# Patient Record
Sex: Male | Born: 1970
Health system: Southern US, Community
[De-identification: ages and names within clinical notes are randomized; demographics above are authoritative.]

## PROBLEM LIST (undated history)

## (undated) DIAGNOSIS — T7840XA Allergy, unspecified, initial encounter: Secondary | ICD-10-CM

## (undated) DIAGNOSIS — E785 Hyperlipidemia, unspecified: Secondary | ICD-10-CM

## (undated) HISTORY — DX: Allergy, unspecified, initial encounter: T78.40XA

## (undated) HISTORY — DX: Hyperlipidemia, unspecified: E78.5

## (undated) HISTORY — PX: WISDOM TOOTH EXTRACTION: SHX21

---

## 2006-04-19 ENCOUNTER — Encounter: Admission: RE | Admit: 2006-04-19 | Discharge: 2006-04-19 | Payer: Self-pay | Admitting: Internal Medicine

## 2008-05-29 ENCOUNTER — Ambulatory Visit: Payer: Self-pay | Admitting: Internal Medicine

## 2008-07-17 ENCOUNTER — Ambulatory Visit: Payer: Self-pay | Admitting: Internal Medicine

## 2008-12-17 ENCOUNTER — Ambulatory Visit: Payer: Self-pay | Admitting: Internal Medicine

## 2009-02-02 ENCOUNTER — Ambulatory Visit: Payer: Self-pay | Admitting: Internal Medicine

## 2010-03-14 ENCOUNTER — Ambulatory Visit: Payer: Self-pay | Admitting: Internal Medicine

## 2011-01-23 ENCOUNTER — Encounter: Payer: Self-pay | Admitting: Internal Medicine

## 2011-01-24 ENCOUNTER — Other Ambulatory Visit: Payer: 59 | Admitting: Internal Medicine

## 2011-01-24 DIAGNOSIS — E785 Hyperlipidemia, unspecified: Secondary | ICD-10-CM

## 2011-01-24 LAB — LIPID PANEL
HDL: 54 mg/dL (ref >39)
LDL Cholesterol: 116 mg/dL — ABNORMAL HIGH (ref 0–99)
Triglycerides: 165 mg/dL — ABNORMAL HIGH (ref <150)
VLDL: 33 mg/dL (ref 0–40)

## 2011-01-24 LAB — HEPATIC FUNCTION PANEL
Albumin: 4.8 g/dL (ref 3.5–5.2)
Total Bilirubin: 0.9 mg/dL (ref 0.3–1.2)
Total Protein: 6.7 g/dL (ref 6.0–8.3)

## 2011-01-27 ENCOUNTER — Ambulatory Visit (INDEPENDENT_AMBULATORY_CARE_PROVIDER_SITE_OTHER): Payer: 59 | Admitting: Internal Medicine

## 2011-01-27 ENCOUNTER — Encounter: Payer: Self-pay | Admitting: Internal Medicine

## 2011-01-27 DIAGNOSIS — E785 Hyperlipidemia, unspecified: Secondary | ICD-10-CM | POA: Insufficient documentation

## 2011-01-27 DIAGNOSIS — E669 Obesity, unspecified: Secondary | ICD-10-CM

## 2011-01-27 NOTE — Progress Notes (Signed)
  Subjective:    Patient ID: Jorge Hughes, male    DOB: 1970-08-15, 40 y.o.   MRN: [id number]  HPI pleasant 40 year old white male with history of hyperlipidemia currently on Zocor 20 mg daily. Lipid panel liver functions drawn recently and really showed no change from 2011. Would like to have LDL less than 100. Total cholesterol is 203 and previously was 202. Triglycerides are 165 and previously were 120. HDL is 54. LDL is 116 and previously was 122. Wife is due to  deliver a new baby any day now. Patient is overweight.    Review of Systems     Objective:   Physical Exam chest clear; cardiac exam regular rate and rhythm; extremities without edema        Assessment & Plan:  Hyperlipidemia  Obesity  Encouraged diet and exercise and weight loss. Increase Zocor to 40 mg daily. New prescription written for #30 with 5 refills generic. Schedule physical examination February 2013

## 2011-08-03 ENCOUNTER — Other Ambulatory Visit: Payer: 59 | Admitting: Internal Medicine

## 2011-08-03 DIAGNOSIS — E7800 Pure hypercholesterolemia, unspecified: Secondary | ICD-10-CM

## 2011-08-03 DIAGNOSIS — Z0000 Encounter for general adult medical examination without abnormal findings: Secondary | ICD-10-CM

## 2011-08-03 LAB — LIPID PANEL
Cholesterol: 249 mg/dL — ABNORMAL HIGH (ref 0–200)
LDL Cholesterol: 174 mg/dL — ABNORMAL HIGH (ref 0–99)
Triglycerides: 97 mg/dL (ref <150)

## 2011-08-03 LAB — COMPREHENSIVE METABOLIC PANEL
Albumin: 4.7 g/dL (ref 3.5–5.2)
Alkaline Phosphatase: 73 U/L (ref 39–117)
CO2: 27 mEq/L (ref 19–32)
Calcium: 9.4 mg/dL (ref 8.4–10.5)
Chloride: 101 mEq/L (ref 96–112)
Glucose, Bld: 79 mg/dL (ref 70–99)
Potassium: 4.6 mEq/L (ref 3.5–5.3)
Sodium: 139 mEq/L (ref 135–145)
Total Protein: 6.9 g/dL (ref 6.0–8.3)

## 2011-08-03 LAB — CBC WITH DIFFERENTIAL/PLATELET
HCT: 48.2 % (ref 39.0–52.0)
Hemoglobin: 16.1 g/dL (ref 13.0–17.0)
Lymphocytes Relative: 28 % (ref 12–46)
Lymphs Abs: 1.7 10*3/uL (ref 0.7–4.0)
Monocytes Relative: 6 % (ref 3–12)
Neutro Abs: 3.8 10*3/uL (ref 1.7–7.7)
Neutrophils Relative %: 62 % (ref 43–77)
RBC: 5.34 MIL/uL (ref 4.22–5.81)

## 2011-08-04 ENCOUNTER — Encounter: Payer: Self-pay | Admitting: Internal Medicine

## 2011-08-04 ENCOUNTER — Ambulatory Visit (INDEPENDENT_AMBULATORY_CARE_PROVIDER_SITE_OTHER): Payer: 59 | Admitting: Internal Medicine

## 2011-08-04 VITALS — BP 126/82 | HR 84 | Temp 97.6°F | Ht 69.25 in | Wt 287.0 lb

## 2011-08-04 DIAGNOSIS — E669 Obesity, unspecified: Secondary | ICD-10-CM

## 2011-08-04 DIAGNOSIS — Z0000 Encounter for general adult medical examination without abnormal findings: Secondary | ICD-10-CM

## 2011-08-04 DIAGNOSIS — E785 Hyperlipidemia, unspecified: Secondary | ICD-10-CM

## 2011-08-04 LAB — POCT URINALYSIS DIPSTICK
Bilirubin, UA: NEGATIVE
Blood, UA: NEGATIVE
Glucose, UA: NEGATIVE
Nitrite, UA: NEGATIVE

## 2011-08-20 NOTE — Progress Notes (Signed)
Subjective:    Patient ID: Jorge Hughes, male    DOB: 1971/06/01, 41 y.o.   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 He is here for health maintenance and evaluation and evaluation of medical problems. Patient initially started on Zocor 2010 at that time total cholesterol was 250 with an LDL cholesterol of 162 with normal triglycerides and HDL cholesterol of 67. He had a good response and total cholesterol came down from 250-205 and from 162-131 by July 2010 and we increase Zocor to 20 mg daily. In February 2011 total cholesterol was 202 with an LDL cholesterol of 122 on Zocor 20 mg daily. In August 2012 total cholesterol was 203, triglycerides were 165 and previously had been 120 and LDL cholesterol was 116. Was trying to get LDL below 100 and we increase Zocor to 40 mg daily and that's when the memory issues started.  No known drug allergies. 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 He is allergic to poison ivy. He fractured a rib falling off a horse in 1999. Had a tetanus immunization update on 08/28/2003. Had strep throat 10/03/2007.  Family history: Mother with history of breast cancer. Grandfather with history of coronary artery disease also had CABG. One brother and one sister. Father with history of kidney stones.  Patient is married. Smokes cigars sometimes. Usually drinks beer or liquor daily. Doesn't get much exercise.    Review of Systems  Constitutional: Negative.   HENT: Negative.   Eyes: Negative.   Respiratory: Negative.   Cardiovascular: Negative.   Gastrointestinal: Negative.   Genitourinary: Negative.   Musculoskeletal: Negative.   Hematological: Negative.   Psychiatric/Behavioral: Negative.        Objective:   Physical Exam  Vitals  reviewed. Constitutional: He is oriented to person, place, and time. He appears well-developed and well-nourished.  HENT:  Head: Normocephalic and atraumatic.  Right Ear: External ear normal.  Left Ear: External ear normal.  Mouth/Throat: Oropharynx is clear and moist. No oropharyngeal exudate.  Eyes: Conjunctivae and EOM are normal. Pupils are equal, round, and reactive to light. Right eye exhibits no discharge. Left eye exhibits no discharge. No scleral icterus.  Neck: Neck supple. No JVD present. No thyromegaly present.  Cardiovascular: Normal rate, regular rhythm, normal heart sounds and intact distal pulses.   No murmur heard. Pulmonary/Chest: Effort normal and breath sounds normal. He has no wheezes. He has no rales.  Abdominal: Soft. Bowel sounds are normal. He exhibits no mass. There is no tenderness. There is no rebound.  Genitourinary:       No hernias to direct palpation  Musculoskeletal: Normal range of motion. He exhibits no edema.  Lymphadenopathy:    He has no cervical adenopathy.  Neurological: He is alert and oriented to person, place, and time. He has normal reflexes. He displays normal reflexes. No cranial nerve deficit. Coordination normal.  Skin: Skin is warm and dry. He is not diaphoretic.  Psychiatric: He has a normal mood and affect. His behavior is normal.          Assessment & Plan:  Hyperlipidemia-unable to tolerate 40 mg of Zocor because of memory issues  Obesity-patient gets little exercise and doesn't watch diet as closely as he should  Plan: Change to Crestor and recheck lipid panel liver functions  in 3 months. Advised patient to diet and exercise.

## 2011-08-20 NOTE — Patient Instructions (Signed)
Change from Zocor to Crestor and return in 3 months for fasting lipid panel liver functions

## 2012-07-04 ENCOUNTER — Ambulatory Visit: Payer: Self-pay | Admitting: Physician Assistant

## 2012-07-04 VITALS — BP 140/80 | HR 60 | Temp 98.1°F | Resp 18 | Ht 71.0 in | Wt 292.0 lb

## 2012-07-04 DIAGNOSIS — Z0289 Encounter for other administrative examinations: Secondary | ICD-10-CM

## 2012-07-04 NOTE — Patient Instructions (Addendum)

## 2012-07-04 NOTE — Progress Notes (Signed)
Subjective:    Patient ID: Jorge Hughes, male    DOB: [birth year], 42 y.o.   MRN: [id number]  HPI  This 42 y.o. male presents for DOT Medical Certification Examination.   Past Medical History  Diagnosis Date  . Hyperlipidemia     Past Surgical History  Procedure Date  . Wisdom tooth extraction     Prior to Admission medications   Medication Sig Start Date End Date Taking? Authorizing Provider  rosuvastatin (CRESTOR) 10 MG tablet Take 10 mg by mouth daily.   Yes Historical Provider, MD    No Known Allergies  History   Social History  . Marital Status: Married    Spouse Name: Scorpio Fortin    Number of Children: 3  . Years of Education: 20   Occupational History  . General Contractor    Social History Main Topics  . Smoking status: Former Smoker    Types: Cigars  . Smokeless tobacco: Never Used  . Alcohol Use: 1.2 oz/week    2 Cans of beer per week     Comment: socially  . Drug Use: No  . Sexually Active: Yes -- Male partner(s)    Birth Control/ Protection: None   Other Topics Concern  . Not on file   Social History Narrative   Lives with his wife and 3 children.      Family History  Problem Relation Age of Onset  . Cancer Mother 46    breast  . Gallstones Father   . Ovarian cysts Sister   . Cancer Maternal Grandfather   . Diabetes Paternal Grandfather     Review of Systems  Constitutional: Negative.   HENT: Negative.   Eyes: Negative.   Respiratory: Negative.   Cardiovascular: Negative.   Gastrointestinal: Negative.   Genitourinary: Negative.   Musculoskeletal: Negative.   Skin: Negative.   Neurological: Negative.   Hematological: Negative.   Psychiatric/Behavioral: Negative.        Objective:   Physical Exam  Vitals reviewed. Constitutional: He is oriented to person, place, and time. He appears well-developed and well-nourished. He is active and cooperative. No distress.  HENT:  Head: Normocephalic and atraumatic.    Right Ear: Hearing, tympanic membrane, external ear and ear canal normal.  Left Ear: Hearing, tympanic membrane, external ear and ear canal normal.  Nose: Nose normal.  Mouth/Throat: Uvula is midline and oropharynx is clear and moist. He does not have dentures. No oral lesions. Normal dentition. No uvula swelling or dental caries. No oropharyngeal exudate.  Eyes: Conjunctivae normal, EOM and lids are normal. Pupils are equal, round, and reactive to light. Right eye exhibits no discharge. Left eye exhibits no discharge. No scleral icterus.  Fundoscopic exam:      The right eye shows no arteriolar narrowing, no AV nicking, no exudate, no hemorrhage and no papilledema. The right eye shows no red reflex.      The left eye shows no arteriolar narrowing, no AV nicking, no exudate, no hemorrhage and no papilledema. The left eye shows no red reflex. Neck: Normal range of motion and full passive range of motion without pain. Neck supple. No spinous process tenderness and no muscular tenderness present. No mass and no thyromegaly present.  Cardiovascular: Normal rate, regular rhythm, normal heart sounds and intact distal pulses.   Pulses:      Radial pulses are 2+ on the right side, and 2+ on the left side.       Dorsalis pedis pulses are 2+  on the right side, and 2+ on the left side.       Posterior tibial pulses are 2+ on the right side, and 2+ on the left side.  Pulmonary/Chest: Effort normal and breath sounds normal.  Abdominal: Soft. Bowel sounds are normal. There is no hepatosplenomegaly. There is no tenderness. There is no CVA tenderness. No hernia. Hernia confirmed negative in the right inguinal area and confirmed negative in the left inguinal area.  Genitourinary: Testes normal and penis normal. Circumcised.  Musculoskeletal: Normal range of motion. He exhibits no edema and no tenderness.       Cervical back: Normal.       Thoracic back: Normal.       Lumbar back: Normal.  Lymphadenopathy:        Head (right side): No tonsillar, no preauricular, no posterior auricular and no occipital adenopathy present.       Head (left side): No tonsillar, no preauricular, no posterior auricular and no occipital adenopathy present.    He has no cervical adenopathy.       Right: No inguinal and no supraclavicular adenopathy present.       Left: No inguinal and no supraclavicular adenopathy present.  Neurological: He is alert and oriented to person, place, and time. He has normal strength and normal reflexes. No cranial nerve deficit or sensory deficit. Coordination normal.  Skin: Skin is warm, dry and intact. No rash noted. No cyanosis or erythema. Nails show no clubbing.  Psychiatric: He has a normal mood and affect. His speech is normal and behavior is normal. Judgment and thought content normal. Cognition and memory are normal.   No labs indicated.      Assessment & Plan:   1. Health examination of defined subpopulation   Forms completed. 2 year certification given.  Card expires 07/04/2014.

## 2012-08-02 ENCOUNTER — Other Ambulatory Visit: Payer: Self-pay | Admitting: Internal Medicine

## 2012-09-16 ENCOUNTER — Other Ambulatory Visit: Payer: Self-pay | Admitting: Internal Medicine

## 2012-09-16 NOTE — Telephone Encounter (Signed)
Needs appt before we can refill

## 2012-09-17 ENCOUNTER — Other Ambulatory Visit: Payer: Self-pay

## 2012-09-17 MED ORDER — CRESTOR 10 MG PO TABS: 10.0000 mg | ORAL_TABLET | Freq: Every day | ORAL | Status: DC

## 2012-09-17 NOTE — Telephone Encounter (Signed)
Patient has scheduled an appointment for 10/28/2012 with fasting labs prior to OV.

## 2012-09-17 NOTE — Telephone Encounter (Signed)
Addressed this already doesn't he need OV?

## 2012-10-25 ENCOUNTER — Other Ambulatory Visit: Payer: 59 | Admitting: Internal Medicine

## 2012-10-25 DIAGNOSIS — E785 Hyperlipidemia, unspecified: Secondary | ICD-10-CM

## 2012-10-25 DIAGNOSIS — Z79899 Other long term (current) drug therapy: Secondary | ICD-10-CM

## 2012-10-25 LAB — HEPATIC FUNCTION PANEL
AST: 24 U/L (ref 0–37)
Albumin: 4.7 g/dL (ref 3.5–5.2)
Alkaline Phosphatase: 76 U/L (ref 39–117)
Total Protein: 6.9 g/dL (ref 6.0–8.3)

## 2012-10-25 LAB — LIPID PANEL
HDL: 49 mg/dL (ref >39)
LDL Cholesterol: 76 mg/dL (ref 0–99)
Triglycerides: 91 mg/dL (ref <150)

## 2012-10-28 ENCOUNTER — Ambulatory Visit (INDEPENDENT_AMBULATORY_CARE_PROVIDER_SITE_OTHER): Payer: 59 | Admitting: Internal Medicine

## 2012-10-28 ENCOUNTER — Encounter: Payer: Self-pay | Admitting: Internal Medicine

## 2012-10-28 VITALS — BP 126/80 | HR 92 | Wt 292.0 lb

## 2012-10-28 DIAGNOSIS — E785 Hyperlipidemia, unspecified: Secondary | ICD-10-CM

## 2012-11-02 ENCOUNTER — Encounter: Payer: Self-pay | Admitting: Physician Assistant

## 2012-11-02 NOTE — Progress Notes (Signed)
  Subjective:    Patient ID: Jorge Hughes, male    DOB: 08-28-1970, 42 y.o.   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 He initially started on Zocor in 2010 at that time his total cholesterol was 250 with an LDL cholesterol of 162, with normal triglycerides, and an HDL cholesterol of 67. He had a good response with Zocor which was subsequently increased from 10-20 mg daily. In August 2012 total cholesterol was 203, triglycerides 165 and LDL cholesterol was 116. We were trying to get LDL below 100 and therefore increase Zocor 40 mg daily Is when the memory issue started. Subsequently I changed him to Crestor and he says memory issues have started all over again. This is surprising with low dose Crestor.  If he cannot tolerate statin medication, there is not much left to control his cholesterol  except for Zetia. He would light to stay off statin medication at present time and see if memory issues will improve. He needs to diet exercise and lose weight. He is markedly overweight.  Grandfather had history of coronary artery disease. Patient usually drinks beer or liquor daily. Sometimes he smokes cigars.      Review of Systems     Objective:   Physical Exam chest clear to auscultation. Cardiac regular rate normal S1 and S2. Extremities without edema.        Assessment & Plan:  Hyperlipidemia -- patient complaining of memory loss  on Zocor 40 mg daily and Crestor 10 mg daily  Plan: Patient may stay off statin medication for a few months and reassess in 3-6 months. Option would be Zetia 10 mg daily.  His lipid panel 10/25/2012 was entirely normal along with liver functions on 10 mg Crestor daily. Fortunately his HDL cholesterol is fairly high. Triglycerides are usually normal even before starting Zocor/Crestor.

## 2012-11-02 NOTE — Patient Instructions (Addendum)
Stale statin medication and see if memory returns. Consideration will be given to Zetia 10 mg daily. Can followup in 3-6 months. Patient needs to diet exercise and lose weight

## 2013-05-01 ENCOUNTER — Other Ambulatory Visit: Payer: 59 | Admitting: Internal Medicine

## 2013-05-02 ENCOUNTER — Encounter: Payer: 59 | Admitting: Internal Medicine

## 2013-07-01 ENCOUNTER — Other Ambulatory Visit: Payer: 59 | Admitting: Internal Medicine

## 2013-07-01 DIAGNOSIS — Z79899 Other long term (current) drug therapy: Secondary | ICD-10-CM

## 2013-07-01 DIAGNOSIS — E785 Hyperlipidemia, unspecified: Secondary | ICD-10-CM

## 2013-07-01 DIAGNOSIS — Z130 Encounter for screening for diseases of the blood and blood-forming organs and certain disorders involving the immune mechanism: Secondary | ICD-10-CM

## 2013-07-01 LAB — COMPREHENSIVE METABOLIC PANEL
ALT: 30 U/L (ref 0–53)
AST: 24 U/L (ref 0–37)
Albumin: 4.5 g/dL (ref 3.5–5.2)
Alkaline Phosphatase: 70 U/L (ref 39–117)
BILIRUBIN TOTAL: 0.8 mg/dL (ref 0.3–1.2)
BUN: 14 mg/dL (ref 6–23)
CALCIUM: 9.3 mg/dL (ref 8.4–10.5)
CHLORIDE: 103 meq/L (ref 96–112)
CO2: 28 meq/L (ref 19–32)
CREATININE: 0.85 mg/dL (ref 0.50–1.35)
GLUCOSE: 87 mg/dL (ref 70–99)
Potassium: 4.1 mEq/L (ref 3.5–5.3)
Sodium: 140 mEq/L (ref 135–145)
Total Protein: 6.7 g/dL (ref 6.0–8.3)

## 2013-07-01 LAB — CBC WITH DIFFERENTIAL/PLATELET
Basophils Absolute: 0 10*3/uL (ref 0.0–0.1)
Basophils Relative: 1 % (ref 0–1)
EOS ABS: 0.2 10*3/uL (ref 0.0–0.7)
EOS PCT: 3 % (ref 0–5)
HEMATOCRIT: 44.8 % (ref 39.0–52.0)
HEMOGLOBIN: 15.6 g/dL (ref 13.0–17.0)
LYMPHS ABS: 1.7 10*3/uL (ref 0.7–4.0)
LYMPHS PCT: 33 % (ref 12–46)
MCH: 30.3 pg (ref 26.0–34.0)
MCHC: 34.8 g/dL (ref 30.0–36.0)
MCV: 87 fL (ref 78.0–100.0)
MONO ABS: 0.2 10*3/uL (ref 0.1–1.0)
MONOS PCT: 5 % (ref 3–12)
Neutro Abs: 2.9 10*3/uL (ref 1.7–7.7)
Neutrophils Relative %: 58 % (ref 43–77)
Platelets: 240 10*3/uL (ref 150–400)
RBC: 5.15 MIL/uL (ref 4.22–5.81)
RDW: 13.3 % (ref 11.5–15.5)
WBC: 5.1 10*3/uL (ref 4.0–10.5)

## 2013-07-01 LAB — LIPID PANEL
CHOL/HDL RATIO: 3.2 ratio
CHOLESTEROL: 167 mg/dL (ref 0–200)
HDL: 53 mg/dL (ref >39)
LDL Cholesterol: 95 mg/dL (ref 0–99)
TRIGLYCERIDES: 97 mg/dL (ref <150)
VLDL: 19 mg/dL (ref 0–40)

## 2013-07-03 ENCOUNTER — Ambulatory Visit (INDEPENDENT_AMBULATORY_CARE_PROVIDER_SITE_OTHER): Payer: 59 | Admitting: Internal Medicine

## 2013-07-03 ENCOUNTER — Encounter: Payer: Self-pay | Admitting: Internal Medicine

## 2013-07-03 VITALS — BP 136/88 | HR 96 | Ht 69.5 in | Wt 290.0 lb

## 2013-07-03 DIAGNOSIS — Z789 Other specified health status: Secondary | ICD-10-CM

## 2013-07-03 DIAGNOSIS — Z0000 Encounter for general adult medical examination without abnormal findings: Secondary | ICD-10-CM

## 2013-07-03 DIAGNOSIS — E785 Hyperlipidemia, unspecified: Secondary | ICD-10-CM

## 2013-10-09 ENCOUNTER — Telehealth: Payer: Self-pay

## 2013-10-09 DIAGNOSIS — Z0289 Encounter for other administrative examinations: Secondary | ICD-10-CM

## 2013-10-09 MED ORDER — SCOPOLAMINE 1 MG/3DAYS TD PT72: 1.0000 | MEDICATED_PATCH | TRANSDERMAL | Status: DC

## 2013-10-09 NOTE — Telephone Encounter (Signed)
Patient going deep sea fishing tomorrow and is requesting Transderm-Scop patches. Ok per verbal order Dr. Lenord FellersBaxley to send to patient's pharmacy.

## 2013-12-25 ENCOUNTER — Encounter: Payer: Self-pay | Admitting: Internal Medicine

## 2013-12-25 NOTE — Patient Instructions (Signed)
Try Crestor 5 mg daily and return in 6 months. Diet exercise and weight loss. Monitor blood pressure.

## 2013-12-25 NOTE — Progress Notes (Signed)
Subjective:    Patient ID: Jorge ClarityEdward A Rampersaud, male    DOB: 09/10/1970, 43 y.o.   MRN: [id number]  HPI pleasant 43 year old white male in today for health maintenance exam. He is obese and has history of hyperlipidemia. His weight is increased 3 pounds since February 2013. Patient used to take Zocor but he stopped this because of issues with his memory after dose was increased to 40 mg daily in August 2012. He was initially started on Zocor in 2010 because at that time his cholesterol was 250 with an LDL cholesterol of 162 with normal triglycerides and HDL cholesterol of 67. He had a good response with statin medication. Total cholesterol came down from 250-205 and LDL cholesterol came down to 131. We increased his Zocor to 20 mg daily in July 2010. In February 2011 total cholesterol was 202 with an LDL cholesterol of 122 on Zocor 20 mg daily. We were attempting to get his LDL below 100 and therefore we increased his Zocor 40 mg daily after which memory issues started. He is now on Crestor 5 mg daily. Complained of memory issues with Crestor 10 mg daily.  No known drug allergies.  He is allergic to poison ivy.  Had tetanus immunization 2005.  Past medical history: He fractured a rib falling off a horse in 1999. Had strep throat in April 2009.  Social history: He is married. Sometimes smokes cigars. Usually drinks alcohol daily consisting of beer or liquor. Doesn't get a lot of exercise. 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  Family history: Mother with history of breast cancer. Grandfather with history coronary artery disease status post CABG. One brother and one sister. Father with history of kidney stones.    Review of Systems  Constitutional: Negative.   All other systems reviewed and are negative.      Objective:   Physical Exam  Vitals reviewed. Constitutional: He is oriented to person, place, and time. He appears well-developed and well-nourished. No  distress.  HENT:  Head: Normocephalic and atraumatic.  Right Ear: External ear normal.  Left Ear: External ear normal.  Nose: Nose normal.  Mouth/Throat: Oropharynx is clear and moist. No oropharyngeal exudate.  Eyes: Conjunctivae and EOM are normal. Pupils are equal, round, and reactive to light. No scleral icterus.  Neck: Neck supple. No JVD present. No thyromegaly present.  Cardiovascular: Normal rate, regular rhythm, normal heart sounds and intact distal pulses.  Exam reveals no gallop.   No murmur heard. Pulmonary/Chest: Effort normal and breath sounds normal. No respiratory distress. He has no wheezes. He has no rales. He exhibits no tenderness.  Abdominal: Soft. Bowel sounds are normal. He exhibits no distension and no mass. There is no tenderness. There is no rebound and no guarding.  Genitourinary:  No hernias  Musculoskeletal: Normal range of motion. He exhibits no edema.  Lymphadenopathy:    He has no cervical adenopathy.  Neurological: He is alert and oriented to person, place, and time. He has normal reflexes. He displays normal reflexes. No cranial nerve deficit. Coordination normal.  Skin: Skin is warm and dry. He is not diaphoretic.  Psychiatric: He has a normal mood and affect. His behavior is normal. Judgment and thought content normal.          Assessment & Plan:  Hyperlipidemia  Intolerant of Zocor 40 mg daily and Crestor 20 mg daily. Has issues with memory.  Obesity  Borderline hypertension  Plan: Patient really needs to diet exercise and lose  weight. Continue Crestor 5 mg daily. Return in 6 months for office visit lipid panel liver functions and blood pressure check. He should purchase a home blood pressure cuff and check his blood pressure at home.

## 2014-01-05 ENCOUNTER — Other Ambulatory Visit: Payer: 59 | Admitting: Internal Medicine

## 2014-01-05 ENCOUNTER — Other Ambulatory Visit: Payer: Self-pay | Admitting: Internal Medicine

## 2014-01-05 DIAGNOSIS — E785 Hyperlipidemia, unspecified: Secondary | ICD-10-CM

## 2014-01-05 DIAGNOSIS — Z79899 Other long term (current) drug therapy: Secondary | ICD-10-CM

## 2014-01-05 LAB — LIPID PANEL
Cholesterol: 224 mg/dL — ABNORMAL HIGH (ref 0–200)
HDL: 57 mg/dL (ref >39)
LDL CALC: 147 mg/dL — AB (ref 0–99)
TRIGLYCERIDES: 102 mg/dL (ref <150)
Total CHOL/HDL Ratio: 3.9 Ratio
VLDL: 20 mg/dL (ref 0–40)

## 2014-01-05 LAB — HEPATIC FUNCTION PANEL
ALBUMIN: 4.5 g/dL (ref 3.5–5.2)
ALT: 28 U/L (ref 0–53)
AST: 27 U/L (ref 0–37)
Alkaline Phosphatase: 68 U/L (ref 39–117)
BILIRUBIN INDIRECT: 0.8 mg/dL (ref 0.2–1.2)
BILIRUBIN TOTAL: 1 mg/dL (ref 0.2–1.2)
Bilirubin, Direct: 0.2 mg/dL (ref 0.0–0.3)
TOTAL PROTEIN: 6.8 g/dL (ref 6.0–8.3)

## 2014-01-06 ENCOUNTER — Ambulatory Visit (INDEPENDENT_AMBULATORY_CARE_PROVIDER_SITE_OTHER): Payer: 59 | Admitting: Internal Medicine

## 2014-01-06 ENCOUNTER — Encounter: Payer: Self-pay | Admitting: Internal Medicine

## 2014-01-06 VITALS — BP 134/84 | Temp 98.3°F | Wt 295.5 lb

## 2014-01-06 DIAGNOSIS — E785 Hyperlipidemia, unspecified: Secondary | ICD-10-CM

## 2014-01-06 DIAGNOSIS — E669 Obesity, unspecified: Secondary | ICD-10-CM

## 2014-01-06 LAB — HEMOGLOBIN A1C
Hgb A1c MFr Bld: 5.6 % (ref <5.7)
MEAN PLASMA GLUCOSE: 114 mg/dL (ref <117)

## 2014-01-06 MED ORDER — EZETIMIBE 10 MG PO TABS: 10.0000 mg | ORAL_TABLET | Freq: Every day | ORAL | Status: DC

## 2014-01-07 NOTE — Progress Notes (Signed)
Patient informed. 

## 2014-01-18 NOTE — Patient Instructions (Addendum)
Try Zetia 10 mg daily. Encouraged diet exercise and weight loss. Return October 2014.

## 2014-01-18 NOTE — Progress Notes (Signed)
   Subjective:    Patient ID: Jorge ClarityEdward A Nyce, male    DOB: 03/22/1971, 43 y.o.   MRN: [id number]  HPI Here today to followup on hyperlipidemia. Says Lipitor and Crestor have caused issues with his memory. He wants to try Zetia. He continues to gain weight. Explained to him that ZE a would probably not be as effective as a statin medication. Currently on no medication for hyperlipidemia. Lipid panel reviewed. Liver functions are normal but he is on no medication.    Review of Systems     Objective:   Physical Exam  Not examined. Spent 15 minutes reviewing lab work with him and discussing lipid control, diet and exercise      Assessment & Plan:  Hyperlipidemia  Statin intolerance-patient states that he has issues with memory when he is taking statins  Obesity-recommend diet and exercise and weight loss  Plan: Try Zetia 10 mg daily and recheck lipid panel without liver functions in October 2014. Counseled regarding diet exercise and weight loss.

## 2014-04-13 ENCOUNTER — Other Ambulatory Visit: Payer: 59 | Admitting: Internal Medicine

## 2014-04-14 ENCOUNTER — Ambulatory Visit: Payer: 59 | Admitting: Internal Medicine

## 2014-04-27 ENCOUNTER — Other Ambulatory Visit: Payer: 59 | Admitting: Internal Medicine

## 2014-04-27 DIAGNOSIS — E785 Hyperlipidemia, unspecified: Secondary | ICD-10-CM

## 2014-04-27 LAB — LIPID PANEL
CHOL/HDL RATIO: 3.3 ratio
CHOLESTEROL: 192 mg/dL (ref 0–200)
HDL: 58 mg/dL (ref >39)
LDL Cholesterol: 115 mg/dL — ABNORMAL HIGH (ref 0–99)
TRIGLYCERIDES: 95 mg/dL (ref <150)
VLDL: 19 mg/dL (ref 0–40)

## 2014-04-28 ENCOUNTER — Ambulatory Visit (INDEPENDENT_AMBULATORY_CARE_PROVIDER_SITE_OTHER): Payer: 59 | Admitting: Internal Medicine

## 2014-04-28 ENCOUNTER — Encounter: Payer: Self-pay | Admitting: Internal Medicine

## 2014-04-28 VITALS — BP 122/90 | HR 83 | Temp 97.6°F | Wt 277.0 lb

## 2014-04-28 DIAGNOSIS — E669 Obesity, unspecified: Secondary | ICD-10-CM

## 2014-04-28 DIAGNOSIS — E785 Hyperlipidemia, unspecified: Secondary | ICD-10-CM

## 2014-04-28 NOTE — Progress Notes (Signed)
   Subjective:    Patient ID: Jorge ClarityEdward A Haglund, male    DOB: 06/18/1971, 43 y.o.   MRN: [id number]  HPI At last visit August 2015, he weighed 295 pounds. He now weighs 277 pounds. He is been on Zetia 10 mg daily and tolerating well. However it is expensive despite having a coupon. He was unable to tolerate Lipitor and Crestor. He says these cost issues with his memory. Significant improvement in total cholesterol. In July 2015, total cholesterol was 224 and is now 192. LDL cholesterol was 147 and is now 115.  He has been getting some exercise with the elliptical machine. Trying to  eat more healthy diet.  Review of Systems     Objective:   Physical Exam  Not examined. 15 minutes speaking with patient      Assessment & Plan:  Hyperlipidemia  Obesity  Plan: Continue Zetia 10 mg daily and return in May 2016 for physical exam.

## 2014-04-28 NOTE — Patient Instructions (Addendum)
Continue Zetia 10 mg daily. Return in May 2016 for physical examination.Continue diet and exercise

## 2014-07-05 ENCOUNTER — Ambulatory Visit (INDEPENDENT_AMBULATORY_CARE_PROVIDER_SITE_OTHER): Payer: Self-pay | Admitting: Family Medicine

## 2014-07-05 VITALS — BP 118/68 | HR 71 | Temp 98.6°F | Resp 17 | Ht 69.5 in | Wt 292.0 lb

## 2014-07-05 DIAGNOSIS — Z008 Encounter for other general examination: Secondary | ICD-10-CM

## 2014-07-05 DIAGNOSIS — Z0289 Encounter for other administrative examinations: Secondary | ICD-10-CM

## 2014-07-05 NOTE — Progress Notes (Signed)
Commercial Driver Medical Examination   Jorge Hughes is a 44 y.o. male who presents today for a commercial driver fitness determination physical exam. The patient reports no problems. The following portions of the patient's history were reviewed and updated as appropriate: allergies, current medications, past family history, past medical history, past social history, past surgical history and problem list. HPL  On zetia and crestor followed by PCP Dr. Lenord FellersBaxley.  No other chronic medical problems. Review of Systems A comprehensive review of systems was negative.  Rib pain - discussed in workers comp - paper file Objective:    Vision:  Visual Acuity Screening   Right eye Left eye Both eyes  Without correction:     With correction: 20/15 20/15 20/15    Peripheral Vision: Right eye 85 degrees. Left eye 85 degrees. The patient can distinguish the colors red, amber and green.   Applicant can recognize and distinguish among traffic control signals and devices showing standard red, green, and amber colors.   Monocular Vision?: No  Hearing:The patient was able to hear a forced whisper from L=10, R=10 feet.      BP 118/68 mmHg  Pulse 71  Temp(Src) 98.6 F (37 C) (Oral)  Resp 17  Ht 5' 9.5" (1.765 m)  Wt 292 lb (132.45 kg)  BMI 42.52 kg/m2  SpO2 99%  General Appearance:    Alert, cooperative, no distress, appears stated age  Head:    Normocephalic, without obvious abnormality, atraumatic  Eyes:    PERRL, conjunctiva/corneas clear, EOM's intact, fundi    benign, both eyes       Ears:    Normal TM's and external ear canals, both ears  Nose:   Nares normal, septum midline, mucosa normal, no drainage    or sinus tenderness  Throat:   Lips, mucosa, and tongue normal; teeth and gums normal  Neck:   Supple, symmetrical, trachea midline, no adenopathy;       thyroid:  No enlargement/tenderness/nodules; no carotid   bruit or JVD  Back:     Symmetric, no curvature, ROM normal, no CVA  tenderness  Lungs:     Clear to auscultation bilaterally, respirations unlabored  Chest wall:    No tenderness or deformity  Heart:    Regular rate and rhythm, S1 and S2 normal, no murmur, rub   or gallop  Abdomen:     Soft, non-tender, bowel sounds active all four quadrants,    no masses, no organomegaly  Genitalia:  No inguinal hernias  Rectal:    Extremities:   Extremities normal, atraumatic, no cyanosis or edema  Pulses:   2+ and symmetric all extremities  Skin:   Skin color, texture, turgor normal, no rashes or lesions  Lymph nodes:   Cervical, supraclavicular, and axillary nodes normal  Neurologic:   CNII-XII intact. Normal strength, sensation and reflexes      throughout    Labs: SG 1.015, neg prot, neg blood, neg sugar Assessment:    Healthy male exam.  Meets standards in 2749 CFR 391.41;  qualifies for 2 year certificate.   Wearing corrective lenses.  Plan:    Medical examiners certificate completed and printed. Return as needed.

## 2014-08-28 ENCOUNTER — Other Ambulatory Visit: Payer: Self-pay | Admitting: Internal Medicine

## 2014-10-27 ENCOUNTER — Other Ambulatory Visit: Payer: 59 | Admitting: Internal Medicine

## 2014-10-27 DIAGNOSIS — Z13220 Encounter for screening for lipoid disorders: Secondary | ICD-10-CM

## 2014-10-27 DIAGNOSIS — Z0000 Encounter for general adult medical examination without abnormal findings: Secondary | ICD-10-CM

## 2014-10-27 LAB — CBC WITH DIFFERENTIAL/PLATELET
BASOS ABS: 0 10*3/uL (ref 0.0–0.1)
BASOS PCT: 0 % (ref 0–1)
Eosinophils Absolute: 0.1 10*3/uL (ref 0.0–0.7)
Eosinophils Relative: 2 % (ref 0–5)
HCT: 46.2 % (ref 39.0–52.0)
HEMOGLOBIN: 16 g/dL (ref 13.0–17.0)
LYMPHS PCT: 32 % (ref 12–46)
Lymphs Abs: 1.6 10*3/uL (ref 0.7–4.0)
MCH: 30.9 pg (ref 26.0–34.0)
MCHC: 34.6 g/dL (ref 30.0–36.0)
MCV: 89.4 fL (ref 78.0–100.0)
MPV: 10.1 fL (ref 8.6–12.4)
Monocytes Absolute: 0.2 10*3/uL (ref 0.1–1.0)
Monocytes Relative: 4 % (ref 3–12)
NEUTROS ABS: 3.1 10*3/uL (ref 1.7–7.7)
Neutrophils Relative %: 62 % (ref 43–77)
PLATELETS: 246 10*3/uL (ref 150–400)
RBC: 5.17 MIL/uL (ref 4.22–5.81)
RDW: 13.3 % (ref 11.5–15.5)
WBC: 5 10*3/uL (ref 4.0–10.5)

## 2014-10-27 LAB — LIPID PANEL
Cholesterol: 210 mg/dL — ABNORMAL HIGH (ref 0–200)
HDL: 64 mg/dL (ref >=40)
LDL CALC: 122 mg/dL — AB (ref 0–99)
TRIGLYCERIDES: 119 mg/dL (ref <150)
Total CHOL/HDL Ratio: 3.3 Ratio
VLDL: 24 mg/dL (ref 0–40)

## 2014-10-27 LAB — COMPLETE METABOLIC PANEL WITH GFR
ALBUMIN: 4.5 g/dL (ref 3.5–5.2)
ALK PHOS: 68 U/L (ref 39–117)
ALT: 33 U/L (ref 0–53)
AST: 26 U/L (ref 0–37)
BUN: 13 mg/dL (ref 6–23)
CALCIUM: 9.4 mg/dL (ref 8.4–10.5)
CO2: 25 meq/L (ref 19–32)
Chloride: 104 mEq/L (ref 96–112)
Creat: 0.83 mg/dL (ref 0.50–1.35)
GFR, Est African American: >89 mL/min
GLUCOSE: 93 mg/dL (ref 70–99)
Potassium: 4.6 mEq/L (ref 3.5–5.3)
Sodium: 139 mEq/L (ref 135–145)
TOTAL PROTEIN: 6.7 g/dL (ref 6.0–8.3)
Total Bilirubin: 0.9 mg/dL (ref 0.2–1.2)

## 2014-10-29 ENCOUNTER — Ambulatory Visit (INDEPENDENT_AMBULATORY_CARE_PROVIDER_SITE_OTHER): Payer: 59 | Admitting: Internal Medicine

## 2014-10-29 ENCOUNTER — Encounter: Payer: Self-pay | Admitting: Internal Medicine

## 2014-10-29 VITALS — BP 126/82 | HR 74 | Temp 97.9°F | Ht 70.0 in | Wt 288.0 lb

## 2014-10-29 DIAGNOSIS — Z23 Encounter for immunization: Secondary | ICD-10-CM | POA: Diagnosis not present

## 2014-10-29 DIAGNOSIS — E669 Obesity, unspecified: Secondary | ICD-10-CM | POA: Diagnosis not present

## 2014-10-29 DIAGNOSIS — Z0000 Encounter for general adult medical examination without abnormal findings: Secondary | ICD-10-CM

## 2014-10-29 DIAGNOSIS — E785 Hyperlipidemia, unspecified: Secondary | ICD-10-CM

## 2014-10-29 DIAGNOSIS — Z889 Allergy status to unspecified drugs, medicaments and biological substances status: Secondary | ICD-10-CM

## 2014-10-29 DIAGNOSIS — Z789 Other specified health status: Secondary | ICD-10-CM

## 2014-10-29 LAB — POCT URINALYSIS DIPSTICK
BILIRUBIN UA: NEGATIVE
Blood, UA: NEGATIVE
GLUCOSE UA: NEGATIVE
KETONES UA: NEGATIVE
LEUKOCYTES UA: NEGATIVE
Nitrite, UA: NEGATIVE
PROTEIN UA: NEGATIVE
Spec Grav, UA: 1.015
Urobilinogen, UA: NEGATIVE
pH, UA: 6.5

## 2014-11-15 ENCOUNTER — Encounter: Payer: Self-pay | Admitting: Internal Medicine

## 2014-11-15 DIAGNOSIS — Z789 Other specified health status: Secondary | ICD-10-CM | POA: Insufficient documentation

## 2014-11-15 NOTE — Patient Instructions (Signed)
Continue Zetia 10 mg daily. Tdap given today. Return in 6 months.

## 2014-11-15 NOTE — Progress Notes (Signed)
Subjective:    Patient ID: Jorge Hughes, male    DOB: Feb 20, 1971, 44 y.o.   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 He has a history of obesity. Currently weighs 288 pounds. In 2013 he weighed 287 pounds. 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 Long-standing history of hyperlipidemia. Not able to tolerate statins. He says it affects his memory. He is currently on Zetia.  He was on Zocor 40 mg daily but stopped in 2012 because of the memory issues. Initially was started on Zocor in 2010 for total cholesterol of 250 with an LDL cholesterol of 162 with normal triglycerides and an HDL of 67. He had a good response in his total cholesterol came down from 250-205 and from 162-131 by July 2010. We increased Zocor to 20 mg daily. In February 2011 his total cholesterol was 202 with an LDL cholesterol of 122 on Zocor 20 mg daily.  In August 2012, total cholesterol was 203, triglycerides 165 and previously had been 120. LDL cholesterol was 116. I was trying to get his LDL below 100. We increased Zocor to 40 mg daily and that's with memory issues started. Even tried him on Crestor and he indicated he still had memory issues. He felt these issues resolved each time when he stopped the statins.  No known drug allergies. He is allergic poison ivy.  Had strep throat 10/03/2007 social history: He is married. He occasionally smokes cigars. Usually drinks per or liquor daily. Doesn't get that much exercise. 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    Review of Systems  Constitutional: Negative.   All other systems reviewed and are negative.      Objective:   Physical Exam  Constitutional: He is oriented to person, place, and time. He appears well-developed and well-nourished. No distress.    HENT:  Head: Normocephalic and atraumatic.  Right Ear: External ear normal.  Left Ear: External ear normal.  Mouth/Throat: Oropharynx is clear and moist. No oropharyngeal exudate.  Eyes: Conjunctivae and EOM are normal. Pupils are equal, round, and reactive to light. Right eye exhibits no discharge. Left eye exhibits no discharge. No scleral icterus.  Neck: Neck supple. No JVD present. No thyromegaly present.  Cardiovascular: Normal rate, regular rhythm, normal heart sounds and intact distal pulses.   No murmur heard. Pulmonary/Chest: Effort normal and breath sounds normal. No respiratory distress. He has no wheezes. He has no rales. He exhibits no tenderness.  Abdominal: Soft. Bowel sounds are normal. He exhibits no distension and no mass. There is no tenderness. There is no rebound and no guarding.  Genitourinary: Prostate normal.  Musculoskeletal: He exhibits no edema.  Lymphadenopathy:    He has no cervical adenopathy.  Neurological: He is alert and oriented to person, place, and time. He has normal reflexes. No cranial nerve deficit. Coordination normal.  Skin: Skin is warm and dry. No rash noted. He is not diaphoretic.  Psychiatric: He has a normal mood and affect. His behavior is normal. Judgment and thought content normal.  Vitals reviewed.         Assessment & Plan:  Hyperlipidemia-unable to tolerate statins because of memory issues. Currently on Zetia. He has actually had good response to Zetia.  Probably needs to watch alcohol intake as well. Triglycerides are normal. LDL is 122 and total cholesterol  was 210.  Obesity-gets little exercise and doesn't watch diet as closely as he should. Really needs to get serious about diet and exercise. He is markedly overweight.  Plan: Continue Zetia 10 mg daily. Tdap Given today. Return in 6 months.

## 2014-12-07 ENCOUNTER — Other Ambulatory Visit: Payer: Self-pay | Admitting: Internal Medicine

## 2015-06-08 ENCOUNTER — Other Ambulatory Visit: Payer: 59 | Admitting: Internal Medicine

## 2015-06-08 DIAGNOSIS — Z79899 Other long term (current) drug therapy: Secondary | ICD-10-CM

## 2015-06-08 DIAGNOSIS — E785 Hyperlipidemia, unspecified: Secondary | ICD-10-CM

## 2015-06-08 LAB — HEPATIC FUNCTION PANEL
ALK PHOS: 68 U/L (ref 40–115)
ALT: 40 U/L (ref 9–46)
AST: 27 U/L (ref 10–40)
Albumin: 4.5 g/dL (ref 3.6–5.1)
BILIRUBIN DIRECT: 0.1 mg/dL (ref <=0.2)
BILIRUBIN INDIRECT: 0.5 mg/dL (ref 0.2–1.2)
BILIRUBIN TOTAL: 0.6 mg/dL (ref 0.2–1.2)
Total Protein: 6.6 g/dL (ref 6.1–8.1)

## 2015-06-08 LAB — LIPID PANEL
CHOL/HDL RATIO: 3.1 ratio (ref <=5.0)
Cholesterol: 209 mg/dL — ABNORMAL HIGH (ref 125–200)
HDL: 67 mg/dL (ref >=40)
LDL Cholesterol: 128 mg/dL (ref <130)
Triglycerides: 69 mg/dL (ref <150)
VLDL: 14 mg/dL (ref <30)

## 2015-06-10 ENCOUNTER — Ambulatory Visit: Payer: Self-pay | Admitting: Internal Medicine

## 2015-06-15 ENCOUNTER — Encounter: Payer: Self-pay | Admitting: Internal Medicine

## 2015-06-15 ENCOUNTER — Ambulatory Visit (INDEPENDENT_AMBULATORY_CARE_PROVIDER_SITE_OTHER): Payer: 59 | Admitting: Internal Medicine

## 2015-06-15 VITALS — BP 134/78 | HR 100 | Temp 98.1°F | Resp 20 | Ht 70.0 in | Wt 301.0 lb

## 2015-06-15 DIAGNOSIS — E785 Hyperlipidemia, unspecified: Secondary | ICD-10-CM

## 2015-06-15 DIAGNOSIS — Z889 Allergy status to unspecified drugs, medicaments and biological substances status: Secondary | ICD-10-CM

## 2015-06-15 DIAGNOSIS — Z789 Other specified health status: Secondary | ICD-10-CM

## 2015-06-15 DIAGNOSIS — Z23 Encounter for immunization: Secondary | ICD-10-CM

## 2015-06-15 DIAGNOSIS — E669 Obesity, unspecified: Secondary | ICD-10-CM

## 2015-06-15 NOTE — Patient Instructions (Signed)
Continue diet and exercise efforts. Return in 6 months. Flu vaccine given. Continue Zetia 10 mg daily. It was a pleasure to see you today.

## 2015-06-15 NOTE — Progress Notes (Signed)
   Subjective:    Patient ID: Jorge Hughes, male    DOB: 12/31/1970, 44 y.o.   MRN: [id number]  HPI He is here today to follow-up on hyperlipidemia treated with Zetia. Has warm to be completed for some financial assistance with Zetia which is not gone generic. It works well for him. Lipid panel is within normal limits. However he's put on some weight since last visit. Weight today is 301 pounds. In May weight was 288 pounds so he has gained 13 pounds since then. Realizes that this is not acceptable and he wants to lose 50 pounds in the coming year. Needs to exercise and watch his diet. This is been discussed with him several times previously. Lipid panel normal on Zetia. It works well for him. History of statin intolerance.  Flu vaccine given today.    Review of Systems no new complaints     Objective:   Physical Exam Skin warm and dry. Nodes none. No JVD or thyromegaly. Chest clear to auscultation. Cardiac exam regular rate and rhythm normal S1 and S2. Extremities without edema.       Assessment & Plan:  Statin intolerance  Hyperlipidemia  Obesity  Plan: Encouraged diet exercise and weight loss. Continue Zetia 10 mg daily. Form completed for financial assistance with Zetia. Return in 6 months for physical exam and lab work. Flu vaccine given today.

## 2015-08-06 ENCOUNTER — Telehealth: Payer: Self-pay | Admitting: Internal Medicine

## 2015-08-06 NOTE — Telephone Encounter (Signed)
Appointment made with Dr. Penni Bombard at [facility] @ [phone] for Wed. 2/22 @ 8:45.  Dx:  L Elbow Pain w/pocket of fluid.  ? Olecranon Bursitis.  Refer per Dr. Lenord Fellers.    Spoke with patient and provided appointment date/time.  Patient to arrive by 8:30 and take insurance card.  Patient is aware of office location.  Appointment cancelled for Dr. Lenord Fellers and made with Ortho instead.

## 2015-08-09 ENCOUNTER — Ambulatory Visit: Payer: 59 | Admitting: Internal Medicine

## 2015-11-23 ENCOUNTER — Other Ambulatory Visit (INDEPENDENT_AMBULATORY_CARE_PROVIDER_SITE_OTHER): Payer: 59 | Admitting: Internal Medicine

## 2015-11-23 ENCOUNTER — Other Ambulatory Visit: Payer: 59 | Admitting: Internal Medicine

## 2015-11-23 ENCOUNTER — Encounter: Payer: Self-pay | Admitting: Internal Medicine

## 2015-11-23 DIAGNOSIS — E785 Hyperlipidemia, unspecified: Secondary | ICD-10-CM

## 2015-11-23 LAB — CBC WITH DIFFERENTIAL/PLATELET
BASOS ABS: 33 {cells}/uL (ref 0–200)
BASOS PCT: 1 %
EOS ABS: 66 {cells}/uL (ref 15–500)
Eosinophils Relative: 2 %
HCT: 44.9 % (ref 38.5–50.0)
Hemoglobin: 15.3 g/dL (ref 13.2–17.1)
LYMPHS PCT: 39 %
Lymphs Abs: 1287 cells/uL (ref 850–3900)
MCH: 31.2 pg (ref 27.0–33.0)
MCHC: 34.1 g/dL (ref 32.0–36.0)
MCV: 91.4 fL (ref 80.0–100.0)
MONO ABS: 99 {cells}/uL — AB (ref 200–950)
MONOS PCT: 3 %
MPV: 10.1 fL (ref 7.5–12.5)
NEUTROS ABS: 1815 {cells}/uL (ref 1500–7800)
Neutrophils Relative %: 55 %
PLATELETS: 206 10*3/uL (ref 140–400)
RBC: 4.91 MIL/uL (ref 4.20–5.80)
RDW: 13.7 % (ref 11.0–15.0)
WBC: 3.3 10*3/uL — ABNORMAL LOW (ref 3.8–10.8)

## 2015-11-23 LAB — COMPLETE METABOLIC PANEL WITH GFR
ALT: 41 U/L (ref 9–46)
AST: 28 U/L (ref 10–40)
Albumin: 4.3 g/dL (ref 3.6–5.1)
Alkaline Phosphatase: 63 U/L (ref 40–115)
BUN: 12 mg/dL (ref 7–25)
CHLORIDE: 105 mmol/L (ref 98–110)
CO2: 25 mmol/L (ref 20–31)
CREATININE: 0.84 mg/dL (ref 0.60–1.35)
Calcium: 9.1 mg/dL (ref 8.6–10.3)
GFR, Est African American: >89 mL/min (ref >=60)
GFR, Est Non African American: >89 mL/min (ref >=60)
GLUCOSE: 89 mg/dL (ref 65–99)
Potassium: 4.2 mmol/L (ref 3.5–5.3)
SODIUM: 140 mmol/L (ref 135–146)
TOTAL PROTEIN: 6.3 g/dL (ref 6.1–8.1)
Total Bilirubin: 0.7 mg/dL (ref 0.2–1.2)

## 2015-11-23 LAB — LIPID PANEL
CHOL/HDL RATIO: 3.1 ratio (ref <=5.0)
Cholesterol: 204 mg/dL — ABNORMAL HIGH (ref 125–200)
HDL: 66 mg/dL (ref >=40)
LDL Cholesterol: 123 mg/dL (ref <130)
TRIGLYCERIDES: 74 mg/dL (ref <150)
VLDL: 15 mg/dL (ref <30)

## 2015-11-23 NOTE — Progress Notes (Signed)
   Subjective:    Patient ID: Jorge Hughes, male    DOB: 07/20/1970, 45 y.o.   MRN: [id number]  HPI Patient in today for fasting lab work. Needs to move appointment for physical exam until another time. He was to be seen later this week for physical exam.    Review of Systems     Objective:   Physical Exam   Not examined. Lab drawl only     Assessment & Plan:  Hyperlipidemia  Obesity  Plan: Patient had lab drawn today and will be moving CPE appointment to another date

## 2015-11-24 LAB — PSA: PSA: 0.29 ng/mL (ref <=4.00)

## 2015-11-25 ENCOUNTER — Encounter: Payer: 59 | Admitting: Internal Medicine

## 2015-12-03 ENCOUNTER — Encounter: Payer: Self-pay | Admitting: Internal Medicine

## 2015-12-03 ENCOUNTER — Ambulatory Visit (INDEPENDENT_AMBULATORY_CARE_PROVIDER_SITE_OTHER): Payer: 59 | Admitting: Internal Medicine

## 2015-12-03 VITALS — BP 120/70 | HR 99 | Temp 98.5°F | Resp 18 | Ht 70.0 in | Wt 289.0 lb

## 2015-12-03 DIAGNOSIS — R7989 Other specified abnormal findings of blood chemistry: Secondary | ICD-10-CM

## 2015-12-03 DIAGNOSIS — L210 Seborrhea capitis: Secondary | ICD-10-CM

## 2015-12-03 DIAGNOSIS — L219 Seborrheic dermatitis, unspecified: Secondary | ICD-10-CM

## 2015-12-03 DIAGNOSIS — Z0000 Encounter for general adult medical examination without abnormal findings: Secondary | ICD-10-CM | POA: Diagnosis not present

## 2015-12-03 DIAGNOSIS — E785 Hyperlipidemia, unspecified: Secondary | ICD-10-CM | POA: Diagnosis not present

## 2015-12-03 MED ORDER — KETOCONAZOLE 2 % EX SHAM: MEDICATED_SHAMPOO | CUTANEOUS | Status: DC

## 2015-12-03 NOTE — Progress Notes (Signed)
Subjective:    Patient ID: Jorge ClarityEdward A Aaronson, male    DOB: 04/23/1971, 45 y.o.   MRN: [id number]  HPI 45 year old White Male for health maintenance exam and evaluation of medical issues. History of obesity, hyperlipidemia, statin intolerance. He does well with Zetia. He says statins affect his memory. Weight May 2016 was 288 pounds and is now 289 pounds. In 2013 he weighed 287 pounds. He has a pedometer on his phone and realizes he is walking about 7 or 8 miles a day with his job. Not getting to exercise with elliptical machine as much as he would like. 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  Unfortunately his BMI is 41. We had discussion about this today.  He was on Zocor 40 mg daily but stopped that in 2012 because of his memory issues. He was started initially on Zocor in 2010 for total cholesterol of 250 with an LDL cholesterol of 162 with normal triglycerides and HDL of 67. He had good response with statins that obviously felt that it affected his memory. Noticed considerable difference on Zetia. We try Crestor but he still had some concerns with his memory.  He is allergic to poison ivy.  No known drug allergies  Strep throat April 2009.  Social history: He is married. Occasionally smokes cigars. 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 Social alcohol consumption. 2 daughters ages 3211 and 135. One son age 568.  Family history: Mother with history of breast cancer. Grandfather with history of CABG. Father with history of kidney stones. One brother and one sister.        Review of Systems new issue today is scaly area top of his head. Has tried tea tree oral without relief.     Objective:   Physical Exam  Constitutional: He is oriented to person, place, and time. He appears well-developed and well-nourished.  HENT:  Head: Normocephalic and atraumatic.  Right  Ear: External ear normal.  Left Ear: External ear normal.  Nose: Nose normal.  Mouth/Throat: Oropharynx is clear and moist. No oropharyngeal exudate.  Eyes: Conjunctivae and EOM are normal. Pupils are equal, round, and reactive to light. Right eye exhibits no discharge. Left eye exhibits no discharge. No scleral icterus.  Neck: Neck supple. No JVD present. No thyromegaly present.  Cardiovascular: Normal rate, regular rhythm, normal heart sounds and intact distal pulses.   No murmur heard. Pulmonary/Chest: Effort normal and breath sounds normal. No respiratory distress. He has no wheezes. He has no rales.  Abdominal: Soft. Bowel sounds are normal. He exhibits no distension and no mass. There is no tenderness. There is no rebound and no guarding.  Genitourinary: Prostate normal.  Musculoskeletal: He exhibits no edema.  Lymphadenopathy:    He has no cervical adenopathy.  Neurological: He is alert and oriented to person, place, and time. He has normal reflexes. No cranial nerve deficit. Coordination normal.  Skin: Skin is warm and dry. He is not diaphoretic.  Scaly $0.50 size area top of scalp consistent with seborrhea  Psychiatric: He has a normal mood and affect. His behavior is normal. Judgment and thought content normal.  Vitals reviewed.         Assessment & Plan:  Normal health maintenance exam  BMI 41-needs to lose weight. Discussion about this today.  Seborrhea of scalp  Hyperlipidemia-lipid panel stable from last year. He has a good HDL  cholesterol of 66. LDL is 123. Total cholesterol was 204.  Plan: Continue diet exercise and weight loss efforts. Continue Zetia. Nizoral shampoo prescribed. Return in 6 months.

## 2015-12-03 NOTE — Patient Instructions (Addendum)
It was a pleasure to see you today. Please continue to work on diet and exercise and return in 6 months. Continue Zetia. Use Nizoral shampoo twice weekly.

## 2016-05-17 ENCOUNTER — Ambulatory Visit (INDEPENDENT_AMBULATORY_CARE_PROVIDER_SITE_OTHER): Payer: 59 | Admitting: Emergency Medicine

## 2016-05-17 ENCOUNTER — Ambulatory Visit (INDEPENDENT_AMBULATORY_CARE_PROVIDER_SITE_OTHER): Payer: 59

## 2016-05-17 VITALS — BP 132/80 | HR 76 | Temp 98.4°F | Resp 17 | Ht 71.0 in | Wt 316.0 lb

## 2016-05-17 DIAGNOSIS — Z23 Encounter for immunization: Secondary | ICD-10-CM

## 2016-05-17 DIAGNOSIS — S61001A Unspecified open wound of right thumb without damage to nail, initial encounter: Secondary | ICD-10-CM

## 2016-05-17 MED ORDER — CEPHALEXIN 500 MG PO CAPS: 500.0000 mg | ORAL_CAPSULE | Freq: Three times a day (TID) | ORAL | 0 refills | Status: DC

## 2016-05-17 NOTE — Patient Instructions (Addendum)
IF you received an x-ray today, you will receive an invoice from [facility]. Please contact [facility] at [phone] with questions or concerns regarding your invoice.   IF you received labwork today, you will receive an invoice from [facility]. Please contact [facility] at [phone] with questions or concerns regarding your invoice.   Our billing staff will not be able to assist you with questions regarding bills from these companies.  You will be contacted with the lab results as soon as they are available. The fastest way to get your results is to activate your My Chart account. Instructions are located on the last page of this paperwork. If you have not heard from us regarding the results in 2 weeks, please contact this office.      Puncture Wound A puncture wound is an injury that is caused by a sharp, thin object that goes through (penetrates) your skin. Usually, a puncture wound does not leave a large opening in your skin, so it may not bleed a lot. However, when you get a puncture wound, dirt or other materials (foreign bodies) can be forced into your wound and break off inside. This increases the chance of infection, such as tetanus. What are the causes? Puncture wounds are caused by any sharp, thin object that goes through your skin, such as:  Animal teeth, as with an animal bite.  Sharp, pointed objects, such as nails, splinters of glass, fishhooks, and needles. What are the signs or symptoms? Symptoms of a puncture wound include:  Pain.  Bleeding.  Swelling.  Bruising.  Fluid leaking from the wound.  Numbness, tingling, or loss of function. How is this diagnosed? This condition is diagnosed with a medical history and physical exam. Your wound will be checked to see if it contains any foreign bodies. You may also have X-rays or other imaging tests. How is this treated? Treatment for a puncture wound depends on  how serious the wound is. It also depends on whether the wound contains any foreign bodies. Treatment for all types of puncture wounds usually starts with:  Controlling the bleeding.  Washing out the wound with a germ-free (sterile) salt-water solution.  Checking the wound for foreign bodies. Treatment may also include:  Having the wound opened surgically to remove a foreign object.  Closing the wound with stitches (sutures) if it continues to bleed.  Covering the wound with antibiotic ointments and a bandage (dressing).  Receiving a tetanus shot.  Receiving a rabies vaccine. Follow these instructions at home: Medicines  Take or apply over-the-counter and prescription medicines only as told by your health care provider.  If you were prescribed an antibiotic, take or apply it as told by your health care provider. Do not stop using the antibiotic even if your condition improves. Wound care  There are many ways to close and cover a wound. For example, a wound can be covered with sutures, skin glue, or adhesive strips.Follow instructions from your health care provider about:  How to take care of your wound.  When and how you should change your dressing.  When you should remove your dressing.  Removing whatever was used to close your wound.  Keep the dressing dry as told by your health care provider. Do not take baths, swim, use a hot tub, or do anything that would put your wound underwater until your health care provider approves.  Clean the wound as told by your health care provider.  Do not  scratch or pick at the wound.  Check your wound every day for signs of infection. Watch for:  Redness, swelling, or pain.  Fluid, blood, or pus. General instructions  Raise (elevate) the injured area above the level of your heart while you are sitting or lying down.  If your puncture wound is in your foot, ask your health care provider if you need to avoid putting weight on your  foot and for how long.  Keep all follow-up visits as told by your health care provider. This is important. Contact a health care provider if:  You received a tetanus shot and you have swelling, severe pain, redness, or bleeding at the injection site.  You have a fever.  Your sutures come out.  You notice a bad smell coming from your wound or your dressing.  You notice something coming out of your wound, such as wood or glass.  Your pain is not controlled with medicine.  You have increased redness, swelling, or pain at the site of your wound.  You have fluid, blood, or pus coming from your wound.  You notice a change in the color of your skin near your wound.  You need to change the dressing frequently due to fluid, blood, or pus draining from your wound.  You develop a new rash.  You develop numbness around your wound. Get help right away if:  You develop severe swelling around your wound.  Your pain suddenly increases and is severe.  You develop painful skin lumps.  You have a red streak going away from your wound.  The wound is on your hand or foot and you cannot properly move a finger or toe.  The wound is on your hand or foot and you notice that your fingers or toes look pale or bluish. This information is not intended to replace advice given to you by your health care provider. Make sure you discuss any questions you have with your health care provider. Document Released: 03/15/2005 Document Revised: 11/08/2015 Document Reviewed: 07/29/2014 Elsevier Interactive Patient Education  2017 [employer]

## 2016-05-17 NOTE — Progress Notes (Addendum)
By signing my name below, I, Stann Oresung-Kai Tsai, attest that this documentation has been prepared under the direction and in the presence of Lesle ChrisSteven Peytan Andringa, MD. Electronically Signed: Stann Oresung-Kai Tsai, Scribe. 05/17/2016 , 11:21 AM .  Patient was seen in room 4 .  Chief Complaint:  Chief Complaint  Patient presents with  . Other    thumb   . Immunizations    flu     HPI: Jorge Hughes is a 45 y.o. male who [facility] complaining of right thumb pain started about 4 days ago. He was doing some wiring over the past weekend, and felt something jab into his thumb. He didn't see any residual pieces in his thumb. He felt the area feeling tighter with swelling this morning, but has greatly improved now. He's been using warm soaks at home. He denies any known drug allergies to antibiotics.   His last Tdap was in 2016.  Immunization History  Administered Date(s) Administered  . Tdap 07/04/2003, 10/29/2014   He works in [employer].  He also requests flu shot today.   Past Medical History:  Diagnosis Date  . Allergy   . Hyperlipidemia    Past Surgical History:  Procedure Laterality Date  . WISDOM TOOTH EXTRACTION     Social History   Social History  . Marital status: Married    Spouse name: [name]  . Number of children: 3  . Years of education: 20   Occupational History  . General Contractor    Social History Main Topics  . Smoking status: Former Smoker    Types: Cigars  . Smokeless tobacco: Never Used  . Alcohol use 1.2 oz/week    2 Cans of beer per week     Comment: socially  . Drug use: No  . Sexual activity: Yes    Partners: Female    Birth control/ protection: None   Other Topics Concern  . None   Social History Narrative   Lives with his wife and 3 children.     Family History  Problem Relation Age of Onset  . Cancer Mother 4562    breast  . Gallstones Father   . Hyperlipidemia Father   . Ovarian cysts Sister   . Cancer Maternal  Grandfather   . Diabetes Paternal Grandfather    No Known Allergies Prior to Admission medications   Medication Sig Start Date End Date Taking? Authorizing Provider  ibuprofen (ADVIL,MOTRIN) 200 MG tablet Take 200 mg by mouth every 6 (six) hours as needed.   Yes Historical Provider, MD  ketoconazole (NIZORAL) 2 % shampoo Shampoo twice weekly. Leave on for 5 minutes and rinse well 12/03/15  Yes Margaree MackintoshMary J Baxley, MD  ZETIA 10 MG tablet TAKE 1 TABLET BY MOUTH EVERY DAY 12/07/14  Yes Margaree MackintoshMary J Baxley, MD     ROS:  Constitutional: negative for fever, chills, night sweats, weight changes, or fatigue  HEENT: negative for vision changes, hearing loss, congestion, rhinorrhea, ST, epistaxis, or sinus pressure Cardiovascular: negative for chest pain or palpitations Respiratory: negative for hemoptysis, wheezing, shortness of breath, or cough Abdominal: negative for abdominal pain, nausea, vomiting, diarrhea, or constipation Dermatological: negative for rash; positive for redness, slight swelling (right thumb) Neurologic: negative for headache, dizziness, or syncope All other systems reviewed and are otherwise negative with the exception to those above and in the HPI.  PHYSICAL EXAM: Vitals:   05/17/16 1034  BP: 132/80  Pulse: 76  Resp: 17  Temp: 98.4  F (36.9 C)   Body mass index is 44.07 kg/m.   General: Alert, no acute distress HEENT:  Normocephalic, atraumatic, oropharynx patent. [facility] Cardiovascular:  Regular rate and rhythm, no rubs murmurs or gallops.  No Carotid bruits, radial pulse intact. No pedal edema.  Respiratory: Clear to auscultation bilaterally.  No wheezes, rales, or rhonchi.  No cyanosis, no use of accessory musculature Abdominal: No organomegaly, abdomen is soft and non-tender, positive bowel sounds. No masses. Musculoskeletal: Gait intact. No edema, tenderness Skin: has a healing incision over the tip of his right thumb, flexor surface; just proximal to this,  is an area of tenderness without mass, with mild increased redness Neurologic: Facial musculature symmetric. Psychiatric: Patient acts appropriately throughout our interaction.  Lymphatic: No cervical or submandibular lymphadenopathy Genitourinary/Anorectal: No acute findings  LABS:   EKG/XRAY:   Dg Finger Thumb Right  Result Date: 05/17/2016 CLINICAL DATA:  RIGHT thumb pain beginning 4 days ago after doing some wiring, felt something jab into is thumb, increased tightness and swelling this morning EXAM: RIGHT THUMB 2+V COMPARISON:  None FINDINGS: Osseous mineralization normal. Joint spaces preserved. No fracture, dislocation, or bone destruction. No radiopaque foreign bodies or soft tissue gas seen. Soft tissue swelling throughout RIGHT thumb. IMPRESSION: Soft tissue swelling RIGHT thumb without acute bony abnormality or radiopaque foreign body. Electronically Signed   By: Ulyses SouthwardMark  Boles M.D.   On: 05/17/2016 11:50     ASSESSMENT/PLAN: We'll treat with soaks and antibiotics. X-ray revealed no foreign body. His tetanus immunization is up-to-date..I personally performed the services described in this documentation, which was scribed in my presence. The recorded information has been reviewed and is accurate.  Gross sideeffects, risk and benefits, and alternatives of medications d/w patient. Patient is aware that all medications have potential sideeffects and we are unable to predict every sideeffect or drug-drug interaction that may occur.  Lesle ChrisSteven Aissata Wilmore MD 05/17/2016 11:13 AM

## 2016-05-22 ENCOUNTER — Other Ambulatory Visit: Payer: 59 | Admitting: Internal Medicine

## 2016-05-22 DIAGNOSIS — E785 Hyperlipidemia, unspecified: Secondary | ICD-10-CM

## 2016-05-22 DIAGNOSIS — Z789 Other specified health status: Secondary | ICD-10-CM

## 2016-05-22 LAB — LIPID PANEL
CHOL/HDL RATIO: 3.3 ratio (ref <5.0)
Cholesterol: 195 mg/dL (ref <200)
HDL: 59 mg/dL (ref >40)
LDL CALC: 116 mg/dL — AB (ref <100)
Triglycerides: 101 mg/dL (ref <150)
VLDL: 20 mg/dL (ref <30)

## 2016-05-26 ENCOUNTER — Ambulatory Visit (INDEPENDENT_AMBULATORY_CARE_PROVIDER_SITE_OTHER): Payer: 59 | Admitting: Internal Medicine

## 2016-05-26 ENCOUNTER — Encounter: Payer: Self-pay | Admitting: Internal Medicine

## 2016-05-26 VITALS — BP 130/88 | HR 86 | Wt 310.0 lb

## 2016-05-26 DIAGNOSIS — E7800 Pure hypercholesterolemia, unspecified: Secondary | ICD-10-CM

## 2016-05-26 DIAGNOSIS — Z789 Other specified health status: Secondary | ICD-10-CM

## 2016-05-26 DIAGNOSIS — R030 Elevated blood-pressure reading, without diagnosis of hypertension: Secondary | ICD-10-CM | POA: Diagnosis not present

## 2016-05-26 NOTE — Progress Notes (Signed)
   Subjective:    Patient ID: Jorge Hughes, male    DOB: 11/26/1970, 45 y.o.   MRN: [id number]  HPI 45 year old Male in today to follow-up on hyperlipidemia. He is intolerant of statin medication but able to take Zetia and has had good result with hyperlipidemia control with Zetia. Continues to be overweight. Doesn't get much exercise. Talked once again about diet exercise and weight loss.    Review of Systems no chest pain or shortness of breath     Objective:   Physical Exam Skin warm and dry. Nodes none. Chest clear. Cardiac exam regular rate and rhythm. No lower extremity edema       Assessment & Plan:  Hyperlipidemia-treated with Zetia. LDL cholesterol is 116 and previously was 123 6 months ago. Total cholesterol is 195 and previously was 2046 months ago. Triglycerides are normal. HDL cholesterol was 59.  Obesity  Elevated diastolic pressure-needs to watch at home  Plan: Return in 6 months for physical exam. Monitor blood pressure at home. Diet exercise and weight loss recommended.

## 2016-06-18 ENCOUNTER — Encounter: Payer: Self-pay | Admitting: Internal Medicine

## 2016-06-18 NOTE — Patient Instructions (Signed)
Please start exercise regimen. Watch diet. Continue Zetia. Monitor blood pressure at home. Return in 6 months.

## 2016-11-23 ENCOUNTER — Ambulatory Visit (INDEPENDENT_AMBULATORY_CARE_PROVIDER_SITE_OTHER): Payer: 59 | Admitting: Internal Medicine

## 2016-11-23 ENCOUNTER — Encounter: Payer: Self-pay | Admitting: Internal Medicine

## 2016-11-23 ENCOUNTER — Ambulatory Visit
Admission: RE | Admit: 2016-11-23 | Discharge: 2016-11-23 | Disposition: A | Discharge status: Home / Self Care | Payer: 59 | Source: Ambulatory Visit | Attending: Internal Medicine | Admitting: Internal Medicine

## 2016-11-23 VITALS — BP 124/80 | HR 88 | Temp 99.4°F | Ht 71.0 in | Wt 305.0 lb

## 2016-11-23 DIAGNOSIS — R059 Cough, unspecified: Secondary | ICD-10-CM

## 2016-11-23 DIAGNOSIS — R05 Cough: Secondary | ICD-10-CM | POA: Diagnosis not present

## 2016-11-23 DIAGNOSIS — J181 Lobar pneumonia, unspecified organism: Secondary | ICD-10-CM

## 2016-11-23 DIAGNOSIS — J189 Pneumonia, unspecified organism: Secondary | ICD-10-CM

## 2016-11-23 MED ORDER — HYDROCODONE-HOMATROPINE 5-1.5 MG/5ML PO SYRP: 5.0000 mL | ORAL_SOLUTION | Freq: Three times a day (TID) | ORAL | 0 refills | Status: DC | PRN

## 2016-11-23 MED ORDER — CEFTRIAXONE SODIUM 1 G IJ SOLR
1.0000 g | Freq: Once | INTRAMUSCULAR | Status: AC
  Administered 2016-11-23: 1 g via INTRAMUSCULAR

## 2016-11-23 NOTE — Progress Notes (Signed)
   Subjective:    Patient ID: Jorge ClarityEdward A Hughes, male    DOB: 09/30/1970, 46 y.o.   MRN: [id number]  HPI 46 year old Male with cough for several days. Not getting better. Has had some discolored sputum production at times but not a lot. Low-grade fever. Father-in-law has similar symptoms. They work together. No recent travel history.    Review of Systems see above-no nausea and vomiting. No shaking chills. Has malaise and fatigue.     Objective:   Physical Exam Skin warm and dry. Nodes none. Pharynx slightly injected. TMs are clear. Neck supple. Chest remarkable for coarse breath sounds left upper lobe. Chest x-ray shows left lower lobe pneumonia.       Assessment & Plan:  Left lower lobe pneumonia  Plan: Levaquin 500 milligrams daily for 10 days. Rocephin 1 g IM given in office. Rest and drink plenty of fluids. Hycodan 1 teaspoon by mouth every 8 hours when necessary cough. Needs follow-up chest x-ray and office visit June 19.

## 2016-11-24 MED ORDER — LEVOFLOXACIN 500 MG PO TABS: 500.0000 mg | ORAL_TABLET | Freq: Every day | ORAL | 0 refills | Status: DC

## 2016-11-26 NOTE — Patient Instructions (Signed)
Levaquin 500 milligrams daily for 10 days. Hycodan 1 teaspoon by mouth every 8 hours when necessary cough. Rocephin 1 g IM given in office. Rest and drink plenty of fluids. Follow-up June 19.

## 2016-12-05 ENCOUNTER — Other Ambulatory Visit: Payer: 59 | Admitting: Internal Medicine

## 2016-12-05 ENCOUNTER — Ambulatory Visit (INDEPENDENT_AMBULATORY_CARE_PROVIDER_SITE_OTHER): Payer: 59 | Admitting: Internal Medicine

## 2016-12-05 ENCOUNTER — Encounter: Payer: Self-pay | Admitting: Internal Medicine

## 2016-12-05 ENCOUNTER — Ambulatory Visit
Admission: RE | Admit: 2016-12-05 | Discharge: 2016-12-05 | Disposition: A | Discharge status: Home / Self Care | Payer: 59 | Source: Ambulatory Visit | Attending: Internal Medicine | Admitting: Internal Medicine

## 2016-12-05 VITALS — BP 136/70 | HR 86 | Temp 97.9°F | Resp 20 | Wt 309.0 lb

## 2016-12-05 DIAGNOSIS — J181 Lobar pneumonia, unspecified organism: Secondary | ICD-10-CM

## 2016-12-05 DIAGNOSIS — J189 Pneumonia, unspecified organism: Secondary | ICD-10-CM | POA: Diagnosis not present

## 2016-12-05 NOTE — Progress Notes (Signed)
   Subjective:    Patient ID: Jorge ClarityEdward A Hughes, male    DOB: 01/23/1971, 46 y.o.   MRN: [id number]  HPI 46 year old Male for follow up on pneumonia. Feeling better. No fever. No chills. No sputum production No SOB. He was seen June 7 with cough for several days. He had low-grade fever and some discolored sputum production. Chest x-ray showed left lower lobe pneumonia. He was treated with Levaquin for 10 days and Rocephin 1 g IM. He was given Hycodan for cough.  Repeat CXR shows resolution of pneumonia.    Review of Systems see above-feeling much better. Cough resolved     Objective:   Physical Exam Skin warm and dry. Nodes none. Neck is supple without JVD thyromegaly or carotid bruits. No adenopathy. Chest clear to auscultation without rales or wheezing. Repeat chest x-ray is clear without evidence of pneumonia       Assessment & Plan:   LLL Pneumonia- resolved  Plan : RTC in August which will be his physical exam appointment.

## 2016-12-05 NOTE — Patient Instructions (Signed)
This pleasure to see you today. Pneumonia has completely resolved. Return in August for physical examination.

## 2016-12-07 ENCOUNTER — Encounter: Payer: 59 | Admitting: Internal Medicine

## 2017-02-05 ENCOUNTER — Other Ambulatory Visit: Payer: 59 | Admitting: Internal Medicine

## 2017-02-05 ENCOUNTER — Other Ambulatory Visit: Payer: Self-pay | Admitting: Internal Medicine

## 2017-02-05 DIAGNOSIS — E785 Hyperlipidemia, unspecified: Secondary | ICD-10-CM

## 2017-02-05 DIAGNOSIS — Z0000 Encounter for general adult medical examination without abnormal findings: Secondary | ICD-10-CM

## 2017-02-05 LAB — CBC WITH DIFFERENTIAL/PLATELET
BASOS PCT: 0 %
Basophils Absolute: 0 cells/uL (ref 0–200)
EOS PCT: 3 %
Eosinophils Absolute: 108 cells/uL (ref 15–500)
HCT: 45.9 % (ref 38.5–50.0)
HEMOGLOBIN: 15.7 g/dL (ref 13.2–17.1)
LYMPHS ABS: 1296 {cells}/uL (ref 850–3900)
Lymphocytes Relative: 36 %
MCH: 31.7 pg (ref 27.0–33.0)
MCHC: 34.2 g/dL (ref 32.0–36.0)
MCV: 92.7 fL (ref 80.0–100.0)
MPV: 9.6 fL (ref 7.5–12.5)
Monocytes Absolute: 144 cells/uL — ABNORMAL LOW (ref 200–950)
Monocytes Relative: 4 %
NEUTROS ABS: 2052 {cells}/uL (ref 1500–7800)
Neutrophils Relative %: 57 %
Platelets: 205 10*3/uL (ref 140–400)
RBC: 4.95 MIL/uL (ref 4.20–5.80)
RDW: 13.8 % (ref 11.0–15.0)
WBC: 3.6 10*3/uL — AB (ref 3.8–10.8)

## 2017-02-06 LAB — LIPID PANEL
CHOL/HDL RATIO: 3.6 ratio (ref <5.0)
Cholesterol: 206 mg/dL — ABNORMAL HIGH (ref <200)
HDL: 57 mg/dL (ref >40)
LDL Cholesterol: 127 mg/dL — ABNORMAL HIGH (ref <100)
Triglycerides: 111 mg/dL (ref <150)
VLDL: 22 mg/dL (ref <30)

## 2017-02-06 LAB — COMPLETE METABOLIC PANEL WITH GFR
ALBUMIN: 4.4 g/dL (ref 3.6–5.1)
ALK PHOS: 71 U/L (ref 40–115)
ALT: 50 U/L — ABNORMAL HIGH (ref 9–46)
AST: 28 U/L (ref 10–40)
BILIRUBIN TOTAL: 0.6 mg/dL (ref 0.2–1.2)
BUN: 12 mg/dL (ref 7–25)
CO2: 23 mmol/L (ref 20–32)
Calcium: 9 mg/dL (ref 8.6–10.3)
Chloride: 105 mmol/L (ref 98–110)
Creat: 0.83 mg/dL (ref 0.60–1.35)
GFR, Est African American: >89 mL/min (ref >=60)
GFR, Est Non African American: >89 mL/min (ref >=60)
GLUCOSE: 94 mg/dL (ref 65–99)
POTASSIUM: 4.1 mmol/L (ref 3.5–5.3)
SODIUM: 139 mmol/L (ref 135–146)
TOTAL PROTEIN: 6.5 g/dL (ref 6.1–8.1)

## 2017-02-08 ENCOUNTER — Ambulatory Visit (INDEPENDENT_AMBULATORY_CARE_PROVIDER_SITE_OTHER): Payer: 59 | Admitting: Internal Medicine

## 2017-02-08 ENCOUNTER — Encounter: Payer: Self-pay | Admitting: Internal Medicine

## 2017-02-08 VITALS — BP 140/88 | HR 79 | Ht 69.0 in | Wt 307.0 lb

## 2017-02-08 DIAGNOSIS — E7800 Pure hypercholesterolemia, unspecified: Secondary | ICD-10-CM

## 2017-02-08 DIAGNOSIS — Z6841 Body Mass Index (BMI) 40.0 and over, adult: Secondary | ICD-10-CM | POA: Diagnosis not present

## 2017-02-08 DIAGNOSIS — Z789 Other specified health status: Secondary | ICD-10-CM | POA: Diagnosis not present

## 2017-02-08 DIAGNOSIS — R7989 Other specified abnormal findings of blood chemistry: Secondary | ICD-10-CM

## 2017-02-08 DIAGNOSIS — Z131 Encounter for screening for diabetes mellitus: Secondary | ICD-10-CM

## 2017-02-08 DIAGNOSIS — Z0000 Encounter for general adult medical examination without abnormal findings: Secondary | ICD-10-CM | POA: Diagnosis not present

## 2017-02-08 DIAGNOSIS — Z23 Encounter for immunization: Secondary | ICD-10-CM | POA: Diagnosis not present

## 2017-02-08 LAB — POCT URINALYSIS DIPSTICK
Bilirubin, UA: NEGATIVE
GLUCOSE UA: NEGATIVE
Ketones, UA: NEGATIVE
LEUKOCYTES UA: NEGATIVE
NITRITE UA: NEGATIVE
Protein, UA: NEGATIVE
RBC UA: NEGATIVE
Spec Grav, UA: 1.025 (ref 1.010–1.025)
UROBILINOGEN UA: 0.2 U/dL
pH, UA: 6 (ref 5.0–8.0)

## 2017-02-08 MED ORDER — EZETIMIBE 10 MG PO TABS: 10.0000 mg | ORAL_TABLET | Freq: Every day | ORAL | 1 refills | Status: DC

## 2017-02-09 LAB — HEMOGLOBIN A1C
Hgb A1c MFr Bld: 5.3 % (ref <5.7)
MEAN PLASMA GLUCOSE: 105 mg/dL

## 2017-02-14 NOTE — Progress Notes (Signed)
Subjective:    Patient ID: Jorge ClarityEdward A Wiker, male    DOB: 11/10/1970, 46 y.o.   MRN: [id number]  HPI 46 year old White Male in today for health maintenance exam and evaluation of medical problems. He takes Zetia for hyperlipidemia. Intolerant of statin medication. He is overweight. We've had multiple discussions about his weight previously.  His BMI is 45.34  He was on Zocor 40 mg daily but stopped that in 2012 because of memory issues. He initially started on Zocor in 2010 for total cholesterol of 250 with an LDL cholesterol of 162 with normal triglycerides and HDL of 67. He had good response with statins but obviously failed it had affected his memory. He noticed considerable improvement in his memory when he started Zetia. We tried Crestor but he still has some concerns with his memory.  He is allergic to poison ivy.  Weight in May 2016 was 288 pounds. In June 2017 it was 289 pounds. In 2013 he weighed 287 pounds. He now weighs 307 pounds. He has gained 19 pounds in the past year. Spoke with him about seeing a dietitian but he doesn't want to do that right now.  Had strep throat in 2009.  No known drug allergies.  Social history: He is married. He occasionally smokes cigars. 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 Social alcohol consumption. 2 daughters ages 8912 and 766. One son age 139.  Family history: Mother with history of breast cancer. Grandfather with history of CABG. Grandfather apparently had impaired glucose tolerance as does patient's father. Father with history of kidney stones. One brother and one sister.    Review of Systems  Constitutional: Negative.   Respiratory: Negative.   Cardiovascular: Negative.   Gastrointestinal: Negative.   Genitourinary: Negative.   Psychiatric/Behavioral: Negative.   All other systems reviewed and are negative.      Objective:   Physical Exam  Constitutional: He is oriented to person, place, and time. He appears well-developed and well-nourished. No distress.  HENT:  Head: Normocephalic and atraumatic.  Right Ear: External ear normal.  Left Ear: External ear normal.  Mouth/Throat: Oropharynx is clear and moist. No oropharyngeal exudate.  Eyes: Pupils are equal, round, and reactive to light. Conjunctivae and EOM are normal. Right eye exhibits no discharge. Left eye exhibits no discharge.  Neck: Neck supple. No JVD present. No thyromegaly present.  Cardiovascular: Normal rate, regular rhythm, normal heart sounds and intact distal pulses.   No murmur heard. Pulmonary/Chest: Effort normal and breath sounds normal. No respiratory distress. He has no wheezes. He has no rales. He exhibits no tenderness.  Abdominal: Soft. Bowel sounds are normal. He exhibits no distension and no mass. There is no tenderness. There is no rebound and no guarding.  Genitourinary: Prostate normal.  Musculoskeletal: He exhibits no edema.  Lymphadenopathy:    He has no cervical adenopathy.  Neurological: He is alert and oriented to person, place, and time. He has normal reflexes. No cranial nerve deficit. Coordination normal.  Skin: Skin is warm and dry. No rash noted. He is not diaphoretic. No erythema. No pallor.  Psychiatric: He has a normal mood and affect. His behavior is normal. Judgment and thought content normal.  Vitals reviewed.         Assessment & Plan:  Morbid obesity  Weight gain 19 pounds since last year  Hyperlipidemia-lipid panel stable on Zetia. Total cholesterol has increased from 195-206. He  has HDL cholesterol of 57 which is stable. LDL has increased from 116-127.  Elevated blood pressure-follow-up on this in 6 months and if persistently elevated he will need medication.  Plan: Counseling regarding diet exercise and weight loss efforts. Continue Zetia. Return in 6 months. Hemoglobin A1c is normal at 5.3%

## 2017-02-14 NOTE — Patient Instructions (Addendum)
Watch blood pressure. Please try to diet exercise and lose weight. Continue Zetia. Return in 6 months.

## 2017-05-30 ENCOUNTER — Other Ambulatory Visit: Payer: Self-pay

## 2017-05-30 MED ORDER — EZETIMIBE 10 MG PO TABS: 10.0000 mg | ORAL_TABLET | Freq: Every day | ORAL | 0 refills | Status: DC

## 2017-07-04 ENCOUNTER — Other Ambulatory Visit: Payer: Self-pay | Admitting: Internal Medicine

## 2017-07-04 DIAGNOSIS — Z79899 Other long term (current) drug therapy: Secondary | ICD-10-CM

## 2017-07-04 DIAGNOSIS — E785 Hyperlipidemia, unspecified: Secondary | ICD-10-CM

## 2017-08-06 ENCOUNTER — Other Ambulatory Visit: Payer: 59 | Admitting: Internal Medicine

## 2017-08-06 DIAGNOSIS — E785 Hyperlipidemia, unspecified: Secondary | ICD-10-CM | POA: Diagnosis not present

## 2017-08-06 DIAGNOSIS — Z79899 Other long term (current) drug therapy: Secondary | ICD-10-CM | POA: Diagnosis not present

## 2017-08-06 LAB — LIPID PANEL
CHOL/HDL RATIO: 3.5 (calc) (ref <5.0)
Cholesterol: 199 mg/dL (ref <200)
HDL: 57 mg/dL (ref >40)
LDL CHOLESTEROL (CALC): 121 mg/dL — AB
NON-HDL CHOLESTEROL (CALC): 142 mg/dL — AB (ref <130)
Triglycerides: 107 mg/dL (ref <150)

## 2017-08-06 LAB — HEPATIC FUNCTION PANEL
AG Ratio: 2 (calc) (ref 1.0–2.5)
ALBUMIN MSPROF: 4.5 g/dL (ref 3.6–5.1)
ALKALINE PHOSPHATASE (APISO): 71 U/L (ref 40–115)
ALT: 40 U/L (ref 9–46)
AST: 27 U/L (ref 10–40)
BILIRUBIN DIRECT: 0.2 mg/dL (ref 0.0–0.2)
BILIRUBIN INDIRECT: 0.5 mg/dL (ref 0.2–1.2)
BILIRUBIN TOTAL: 0.7 mg/dL (ref 0.2–1.2)
Globulin: 2.2 g/dL (calc) (ref 1.9–3.7)
Total Protein: 6.7 g/dL (ref 6.1–8.1)

## 2017-08-09 ENCOUNTER — Encounter: Payer: Self-pay | Admitting: Internal Medicine

## 2017-08-09 ENCOUNTER — Ambulatory Visit (INDEPENDENT_AMBULATORY_CARE_PROVIDER_SITE_OTHER): Payer: 59 | Admitting: Internal Medicine

## 2017-08-09 VITALS — BP 120/82 | HR 99 | Ht 69.0 in | Wt 310.0 lb

## 2017-08-09 DIAGNOSIS — Z6841 Body Mass Index (BMI) 40.0 and over, adult: Secondary | ICD-10-CM

## 2017-08-09 DIAGNOSIS — Z789 Other specified health status: Secondary | ICD-10-CM | POA: Diagnosis not present

## 2017-08-09 DIAGNOSIS — E7800 Pure hypercholesterolemia, unspecified: Secondary | ICD-10-CM

## 2017-08-09 NOTE — Progress Notes (Signed)
   Subjective:    Patient ID: Jorge ClarityEdward A Hughes, male    DOB: 07/21/1970, 47 y.o.   MRN: [id number]  HPI Has been taking generic Zetia and having a lot of flatus and fecal incontinence.  He attributes this to the generic preparation.  Did not have this when he was taken non-generic Zetia.  We have called the pharmacy and they will switch to non-generic.  However there may be a prior authorization involved.  Basically he feels well.  Remains overweight.  Has not been exercising and dieting very much.  He had left lower lobe pneumonia in June 2018.  In August 2018 he weighed 307 pounds.  Now weighs 310 pounds.  This is concerning but we have talked about this at length before.  He is statin intolerant.  LDL cholesterol is 121 and was 127 in August.  Previously was 116 and a year ago.  I think it would normalize with weight loss.    Review of Systems see above-no complaint of chest pain or shortness of breath    Objective:   Physical Exam Chest clear.  Cardiac exam regular rate and rhythm.  Normal S1 and S2      Assessment & Plan:  Hyperlipidemia treated with Zetia  Adverse drug effect with generic Zetia-fecal incontinence and flatus.  Change to non-generic.  Plan: Physical exam due August 2019.

## 2017-08-12 NOTE — Patient Instructions (Signed)
Please continue to work on diet exercise and weight loss.  We will changed to non-generic Zetia.  Return in 6 months for physical exam.

## 2017-11-19 ENCOUNTER — Other Ambulatory Visit: Payer: Self-pay | Admitting: Internal Medicine

## 2018-02-11 ENCOUNTER — Other Ambulatory Visit (INDEPENDENT_AMBULATORY_CARE_PROVIDER_SITE_OTHER): Payer: 59 | Admitting: Internal Medicine

## 2018-02-11 ENCOUNTER — Encounter: Payer: Self-pay | Admitting: Internal Medicine

## 2018-02-11 DIAGNOSIS — Z789 Other specified health status: Secondary | ICD-10-CM

## 2018-02-11 DIAGNOSIS — Z0000 Encounter for general adult medical examination without abnormal findings: Secondary | ICD-10-CM

## 2018-02-11 DIAGNOSIS — E7800 Pure hypercholesterolemia, unspecified: Secondary | ICD-10-CM

## 2018-02-11 DIAGNOSIS — Z6841 Body Mass Index (BMI) 40.0 and over, adult: Secondary | ICD-10-CM

## 2018-02-11 LAB — CBC WITH DIFFERENTIAL/PLATELET
BASOS PCT: 0.5 %
Basophils Absolute: 20 cells/uL (ref 0–200)
EOS ABS: 78 {cells}/uL (ref 15–500)
Eosinophils Relative: 2 %
HCT: 46.6 % (ref 38.5–50.0)
Hemoglobin: 16.2 g/dL (ref 13.2–17.1)
Lymphs Abs: 1392 cells/uL (ref 850–3900)
MCH: 32.3 pg (ref 27.0–33.0)
MCHC: 34.8 g/dL (ref 32.0–36.0)
MCV: 92.8 fL (ref 80.0–100.0)
MPV: 10.6 fL (ref 7.5–12.5)
Monocytes Relative: 3.8 %
Neutro Abs: 2262 cells/uL (ref 1500–7800)
Neutrophils Relative %: 58 %
PLATELETS: 197 10*3/uL (ref 140–400)
RBC: 5.02 10*6/uL (ref 4.20–5.80)
RDW: 12.8 % (ref 11.0–15.0)
TOTAL LYMPHOCYTE: 35.7 %
WBC mixed population: 148 cells/uL — ABNORMAL LOW (ref 200–950)
WBC: 3.9 10*3/uL (ref 3.8–10.8)

## 2018-02-11 LAB — COMPLETE METABOLIC PANEL WITH GFR
AG Ratio: 2 (calc) (ref 1.0–2.5)
ALT: 42 U/L (ref 9–46)
AST: 29 U/L (ref 10–40)
Albumin: 4.6 g/dL (ref 3.6–5.1)
Alkaline phosphatase (APISO): 69 U/L (ref 40–115)
BILIRUBIN TOTAL: 0.9 mg/dL (ref 0.2–1.2)
BUN: 14 mg/dL (ref 7–25)
CHLORIDE: 105 mmol/L (ref 98–110)
CO2: 29 mmol/L (ref 20–32)
Calcium: 9.6 mg/dL (ref 8.6–10.3)
Creat: 1.06 mg/dL (ref 0.60–1.35)
GFR, Est African American: 96 mL/min/{1.73_m2} (ref >=60)
GFR, Est Non African American: 83 mL/min/{1.73_m2} (ref >=60)
GLUCOSE: 87 mg/dL (ref 65–99)
Globulin: 2.3 g/dL (calc) (ref 1.9–3.7)
Potassium: 4.7 mmol/L (ref 3.5–5.3)
Sodium: 142 mmol/L (ref 135–146)
TOTAL PROTEIN: 6.9 g/dL (ref 6.1–8.1)

## 2018-02-11 LAB — LIPID PANEL
CHOLESTEROL: 209 mg/dL — AB (ref <200)
HDL: 53 mg/dL (ref >40)
LDL CHOLESTEROL (CALC): 130 mg/dL — AB
Non-HDL Cholesterol (Calc): 156 mg/dL (calc) — ABNORMAL HIGH (ref <130)
Total CHOL/HDL Ratio: 3.9 (calc) (ref <5.0)
Triglycerides: 148 mg/dL (ref <150)

## 2018-02-12 ENCOUNTER — Other Ambulatory Visit: Payer: 59 | Admitting: Internal Medicine

## 2018-02-14 ENCOUNTER — Ambulatory Visit (INDEPENDENT_AMBULATORY_CARE_PROVIDER_SITE_OTHER): Payer: 59 | Admitting: Internal Medicine

## 2018-02-14 ENCOUNTER — Encounter: Payer: Self-pay | Admitting: Internal Medicine

## 2018-02-14 VITALS — BP 130/82 | Ht 69.0 in | Wt 310.0 lb

## 2018-02-14 DIAGNOSIS — Z0000 Encounter for general adult medical examination without abnormal findings: Secondary | ICD-10-CM

## 2018-02-14 DIAGNOSIS — Z23 Encounter for immunization: Secondary | ICD-10-CM

## 2018-02-14 DIAGNOSIS — E785 Hyperlipidemia, unspecified: Secondary | ICD-10-CM

## 2018-02-14 DIAGNOSIS — Z789 Other specified health status: Secondary | ICD-10-CM | POA: Diagnosis not present

## 2018-02-14 LAB — POCT URINALYSIS DIPSTICK
Appearance: NORMAL
Bilirubin, UA: NEGATIVE
Glucose, UA: NEGATIVE
Ketones, UA: NEGATIVE
Leukocytes, UA: NEGATIVE
NITRITE UA: NEGATIVE
Odor: NORMAL
PROTEIN UA: NEGATIVE
RBC UA: NEGATIVE
Spec Grav, UA: 1.015 (ref 1.010–1.025)
UROBILINOGEN UA: 0.2 U/dL
pH, UA: 6 (ref 5.0–8.0)

## 2018-02-14 NOTE — Progress Notes (Signed)
   Subjective:    Patient ID: Jorge Hughes, male    DOB: 05/22/1971, 47 y.o.   MRN [id number]  HPI Pleasant 47 year old Male in today for evaluation of medical issues.  He takes Zetia for hyperlipidemia.  Intolerant of statin medication.  He is markedly overweight with morbid obesity.  We have had multiple discussions about his weight.  I have suggested that he see Dr. Dalbert GarnetBeasley.  He stopped Zocor in 2012 because of memory issues.  He was started on it in 2010 for Hughes total cholesterol 250 with an LDL cholesterol of 162 with normal triglycerides and HDL of 67.  He had Hughes good response with statins but obviously thought it affected his memory and salt improvement when he started Zetia.  He was tried on Crestor but still had concerns with his memory.  He is allergic to poison ivy.  In May 2016 he weighed 288 pounds and now weighs 310 pounds.  BMI is 45.78.  He had strep throat in 2009.  Had left lower lobe pneumonia in June 2018.  Hemoglobin A1c in 2018 was 5.3%  No known drug allergies.  Social history: He is married.  Occasionally smokes cigars.  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  Social alcohol consumption.  2 daughters and 1 son.  Daughters are ages 6913 and 647 and son is 5010.  Family history: Mother with history of breast cancer.  Grandfather with history of CABG.  Grandfather apparently had impaired glucose tolerance as does the patient's father.  Father with history of kidney stones.  One brother and one sister.    Review of Systems  Constitutional: Negative.   All other systems reviewed and are negative.      Objective:   Physical Exam  Constitutional: He is oriented to person, place, and time. He appears well-developed and well-nourished.  HENT:  Head: Normocephalic.  Right Ear: External ear normal.  Left Ear: External ear normal.  Mouth/Throat: Oropharynx is  clear and moist.  Eyes: Right eye exhibits no discharge. Left eye exhibits no discharge.  Neck: Neck supple. No JVD present. No thyromegaly present.  Cardiovascular: Normal rate, regular rhythm and normal heart sounds.  Pulmonary/Chest: Effort normal and breath sounds normal. No stridor. No respiratory distress. He has no wheezes.  Abdominal: Soft. Bowel sounds are normal. He exhibits no distension and no mass. There is no tenderness. There is no rebound and no guarding.  Musculoskeletal: He exhibits no edema.  Lymphadenopathy:    He has no cervical adenopathy.  Neurological: He is alert and oriented to person, place, and time. He displays normal reflexes. No cranial nerve deficit. Coordination normal.  Skin: Skin is warm and dry. He is not diaphoretic.  Psychiatric: He has Hughes normal mood and affect. His behavior is normal. Thought content normal.  Vitals reviewed.         Assessment & Plan:  Morbid obesity-discussion about diet and exercise.  Needs help with weight loss.  Have recommended Dr. Dalbert GarnetBeasley.  Hyperlipidemia-stable on Zetia  Statin intolerance  Plan: Suggested he see Dr. Dalbert GarnetBeasley for dietary counseling.  Continue Zetia.  Follow-up in 6 months.  Flu vaccine given.  In 2018 he had gained 19 pounds from the previous year and his weight was 307 pounds.  Now weighs 310 pounds.

## 2018-02-15 NOTE — Patient Instructions (Signed)
Please see Dr. Dalbert GarnetBeasley for weight loss counseling.  Return in 6 months.  Flu vaccine given.

## 2018-03-13 ENCOUNTER — Other Ambulatory Visit: Payer: Self-pay | Admitting: Internal Medicine

## 2018-05-07 ENCOUNTER — Other Ambulatory Visit: Payer: Self-pay | Admitting: Internal Medicine

## 2018-08-20 ENCOUNTER — Other Ambulatory Visit: Payer: 59 | Admitting: Internal Medicine

## 2018-08-20 DIAGNOSIS — E7800 Pure hypercholesterolemia, unspecified: Secondary | ICD-10-CM

## 2018-08-21 LAB — HEPATIC FUNCTION PANEL
AG Ratio: 2.4 (calc) (ref 1.0–2.5)
ALKALINE PHOSPHATASE (APISO): 69 U/L (ref 36–130)
ALT: 27 U/L (ref 9–46)
AST: 27 U/L (ref 10–40)
Albumin: 4.7 g/dL (ref 3.6–5.1)
BILIRUBIN DIRECT: 0.2 mg/dL (ref 0.0–0.2)
BILIRUBIN INDIRECT: 0.5 mg/dL (ref 0.2–1.2)
GLOBULIN: 2 g/dL (ref 1.9–3.7)
Total Bilirubin: 0.7 mg/dL (ref 0.2–1.2)
Total Protein: 6.7 g/dL (ref 6.1–8.1)

## 2018-08-21 LAB — LIPID PANEL
CHOL/HDL RATIO: 3.4 (calc) (ref <5.0)
CHOLESTEROL: 169 mg/dL (ref <200)
HDL: 50 mg/dL (ref >=40)
LDL Cholesterol (Calc): 104 mg/dL (calc) — ABNORMAL HIGH
Non-HDL Cholesterol (Calc): 119 mg/dL (calc) (ref <130)
Triglycerides: 65 mg/dL (ref <150)

## 2018-08-22 ENCOUNTER — Ambulatory Visit: Payer: 59 | Admitting: Internal Medicine

## 2018-08-22 ENCOUNTER — Encounter: Payer: Self-pay | Admitting: Internal Medicine

## 2018-08-22 VITALS — Ht 69.0 in

## 2018-08-22 DIAGNOSIS — Z789 Other specified health status: Secondary | ICD-10-CM

## 2018-08-22 DIAGNOSIS — Z7184 Encounter for health counseling related to travel: Secondary | ICD-10-CM

## 2018-08-22 DIAGNOSIS — E7800 Pure hypercholesterolemia, unspecified: Secondary | ICD-10-CM

## 2018-08-22 NOTE — Progress Notes (Signed)
   Subjective:    Patient ID: Jorge Hughes, male    DOB: Mar 12, 1971, 48 y.o.   MRN: [id number]  HPI He is here for 52-month recheck on hyperlipidemia.  He is on Zetia.  Has statin intolerance.  He tolerates Zetia well.  Total cholesterol was 169 and previously was 209 in August.  LDL has decreased from 130 in August to 104.  Triglycerides have improved considerably from 1 48-65.  HDL cholesterol is 50 and previously was 53.  In August he weighed 310 pounds.  Unfortunately the medical assistant did not enter his weight today but it is my recollection that he has lost a little bit of weight recently.    Review of Systems no new complaints except plans to go to [place] with his daughter for Spring break. Travel advice.  Have prescribed in advance, alprazolam for sleep, Zofran for nausea and Cipro for infection.     Objective:   Physical Exam  Not examined today but spent 15 minutes discussing lipid panel and results with him.  I am pleased with his lipids at this point in time.  Once again encourage diet exercise and weight loss.      Assessment & Plan:  Pure hypercholesterolemia  Morbid obesity  Travel advice encounter  Plan: Cipro 500 mg #21 p.o. twice daily prescribed, Zofran 4 mg tablets prescribed for nausea #20, Xanax 1 mg #12 prescribed for sleep during travel.  He is to return in 6 months for physical examination.  Continue to encourage diet exercise and weight loss.

## 2018-08-23 MED ORDER — ALPRAZOLAM 1 MG PO TABS: ORAL_TABLET | ORAL | 0 refills | Status: DC

## 2018-08-23 MED ORDER — CIPROFLOXACIN HCL 500 MG PO TABS: 500.0000 mg | ORAL_TABLET | Freq: Two times a day (BID) | ORAL | 0 refills | Status: DC

## 2018-08-23 MED ORDER — ONDANSETRON HCL 4 MG PO TABS: 4.0000 mg | ORAL_TABLET | Freq: Three times a day (TID) | ORAL | 0 refills | Status: DC | PRN

## 2018-08-30 ENCOUNTER — Other Ambulatory Visit: Payer: Self-pay | Admitting: Internal Medicine

## 2018-10-02 NOTE — Patient Instructions (Addendum)
Patient is planning to travel to [place] in the near future.  Was given Zofran and Cipro prescription for travel well as a mask to the COVID-19 outbreak.

## 2019-02-13 ENCOUNTER — Other Ambulatory Visit: Payer: 59 | Admitting: Internal Medicine

## 2019-02-14 ENCOUNTER — Other Ambulatory Visit: Payer: Self-pay

## 2019-02-14 ENCOUNTER — Other Ambulatory Visit: Payer: 59 | Admitting: Internal Medicine

## 2019-02-14 DIAGNOSIS — E7800 Pure hypercholesterolemia, unspecified: Secondary | ICD-10-CM

## 2019-02-14 DIAGNOSIS — E785 Hyperlipidemia, unspecified: Secondary | ICD-10-CM

## 2019-02-14 LAB — COMPLETE METABOLIC PANEL WITH GFR
AG Ratio: 2.6 (calc) — ABNORMAL HIGH (ref 1.0–2.5)
ALT: 27 U/L (ref 9–46)
AST: 23 U/L (ref 10–40)
Albumin: 4.6 g/dL (ref 3.6–5.1)
Alkaline phosphatase (APISO): 55 U/L (ref 36–130)
BUN: 17 mg/dL (ref 7–25)
CO2: 27 mmol/L (ref 20–32)
Calcium: 9.2 mg/dL (ref 8.6–10.3)
Chloride: 106 mmol/L (ref 98–110)
Creat: 0.89 mg/dL (ref 0.60–1.35)
GFR, Est African American: 117 mL/min/{1.73_m2} (ref >=60)
GFR, Est Non African American: 101 mL/min/{1.73_m2} (ref >=60)
Globulin: 1.8 g/dL (calc) — ABNORMAL LOW (ref 1.9–3.7)
Glucose, Bld: 93 mg/dL (ref 65–99)
Potassium: 4.4 mmol/L (ref 3.5–5.3)
Sodium: 141 mmol/L (ref 135–146)
Total Bilirubin: 1 mg/dL (ref 0.2–1.2)
Total Protein: 6.4 g/dL (ref 6.1–8.1)

## 2019-02-14 LAB — LIPID PANEL
Cholesterol: 191 mg/dL (ref <200)
HDL: 51 mg/dL (ref >=40)
LDL Cholesterol (Calc): 123 mg/dL (calc) — ABNORMAL HIGH
Non-HDL Cholesterol (Calc): 140 mg/dL (calc) — ABNORMAL HIGH (ref <130)
Total CHOL/HDL Ratio: 3.7 (calc) (ref <5.0)
Triglycerides: 75 mg/dL (ref <150)

## 2019-02-14 LAB — CBC WITH DIFFERENTIAL/PLATELET
Absolute Monocytes: 99 cells/uL — ABNORMAL LOW (ref 200–950)
Basophils Absolute: 19 cells/uL (ref 0–200)
Basophils Relative: 0.6 %
Eosinophils Absolute: 81 cells/uL (ref 15–500)
Eosinophils Relative: 2.6 %
HCT: 43.9 % (ref 38.5–50.0)
Hemoglobin: 15 g/dL (ref 13.2–17.1)
Lymphs Abs: 1302 cells/uL (ref 850–3900)
MCH: 31.8 pg (ref 27.0–33.0)
MCHC: 34.2 g/dL (ref 32.0–36.0)
MCV: 93 fL (ref 80.0–100.0)
MPV: 10.5 fL (ref 7.5–12.5)
Monocytes Relative: 3.2 %
Neutro Abs: 1600 cells/uL (ref 1500–7800)
Neutrophils Relative %: 51.6 %
Platelets: 163 10*3/uL (ref 140–400)
RBC: 4.72 10*6/uL (ref 4.20–5.80)
RDW: 12.7 % (ref 11.0–15.0)
Total Lymphocyte: 42 %
WBC: 3.1 10*3/uL — ABNORMAL LOW (ref 3.8–10.8)

## 2019-02-17 ENCOUNTER — Encounter: Payer: Self-pay | Admitting: Internal Medicine

## 2019-02-17 ENCOUNTER — Other Ambulatory Visit: Payer: Self-pay

## 2019-02-17 ENCOUNTER — Ambulatory Visit (INDEPENDENT_AMBULATORY_CARE_PROVIDER_SITE_OTHER): Payer: 59 | Admitting: Internal Medicine

## 2019-02-17 VITALS — BP 120/80 | HR 100 | Temp 98.2°F | Ht 69.0 in | Wt 290.0 lb

## 2019-02-17 DIAGNOSIS — Z23 Encounter for immunization: Secondary | ICD-10-CM

## 2019-02-17 DIAGNOSIS — Z789 Other specified health status: Secondary | ICD-10-CM

## 2019-02-17 DIAGNOSIS — Z0000 Encounter for general adult medical examination without abnormal findings: Secondary | ICD-10-CM | POA: Diagnosis not present

## 2019-02-17 DIAGNOSIS — Z6841 Body Mass Index (BMI) 40.0 and over, adult: Secondary | ICD-10-CM

## 2019-02-17 DIAGNOSIS — E7800 Pure hypercholesterolemia, unspecified: Secondary | ICD-10-CM | POA: Diagnosis not present

## 2019-02-17 LAB — POCT URINALYSIS DIPSTICK
Appearance: NEGATIVE
Bilirubin, UA: NEGATIVE
Blood, UA: NEGATIVE
Glucose, UA: NEGATIVE
Ketones, UA: NEGATIVE
Leukocytes, UA: NEGATIVE
Nitrite, UA: NEGATIVE
Odor: NEGATIVE
Protein, UA: NEGATIVE
Spec Grav, UA: 1.015 (ref 1.010–1.025)
Urobilinogen, UA: 0.2 E.U./dL
pH, UA: 6.5 (ref 5.0–8.0)

## 2019-02-17 NOTE — Patient Instructions (Signed)
Flu vaccine given.  Continue Zetia 10 mg daily.  Continue diet.  Try to get some exercise.  Follow-up in 6 months.

## 2019-02-17 NOTE — Progress Notes (Signed)
   Subjective:    Patient ID: Jorge Hughes, male    DOB: 1971-02-06, 48 y.o.   MRN: [id number]  HPI 48 year old Male for health maintenance exam and evaluation of medical issues.  He has a history of morbid obesity hyperlipidemia and statin intolerance.  Currently on Zetia 10 mg daily.  He lost about 20 pounds over the past year with a keto diet.  He stopped Zocor in 2012 because of memory issues.  He has started it in 2010 for total cholesterol of 250 and an LDL cholesterol of 162 with normal triglycerides and an HDL of 67.  He had a good response with statins but obviously thought it affected his memory and noted improvement when he started Zetia.  He was tried on Crestor but still has concerns with his memory.  He is allergic to poison ivy.  He weighed 310 pounds in 2019 and now weighs 290 pounds.  Had strep throat in 2009.  Left lower lobe pneumonia June 2018.  Has not shown impaired glucose tolerance despite obesity.  No known drug allergies.  Social history: He is married.  Occasionally smokes cigars.  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  Social alcohol consumption.  2 daughters and 1 son.  Family history: Mother with history of breast cancer.  Grandfather with history of CABG.  Grandfather apparently had impaired glucose tolerance as does patient's father.  Father with history of kidney stones.  One brother and 1 sister.    Review of Systems history of allergic rhinitis     Objective:   Physical Exam Weight 290 lbs. BMI 42.83.  Temperature 98.2 degrees .  Height 5 feet 9 inches blood pressure 120/80  Skin warm and dry.  Nodes none.  TMs and pharynx are clear.  Neck is supple without JVD thyromegaly or carotid bruits.  Chest clear to auscultation.  Cardiac exam regular rate and rhythm normal S1 and S2 without murmurs or gallops.  Abdomen no hepatosplenomegaly masses or  tenderness.  Prostate exam deferred until age 23.  No lower extremity edema.  Neuro no focal deficits on brief neurological exam.  His affect judgment and thought process are normal.  Lipid panel shows total cholesterol of 191, HDL 51, triglycerides 75 and LDL 123.  6 months ago his LDL was excellent at 104.  I would like for him to try to get back on track with diet.  We will recheck lipid panel and hemoglobin A1c in 6 months.     Assessment & Plan:  Hyperlipidemia  Statin intolerance  BMI of 42  Plan: He has done well losing 20 pounds with keto diet but has not been dieting as much recently.  Continue Zetia 10 mg daily.  Follow-up in 6 months.  Flu vaccine given.

## 2019-02-27 ENCOUNTER — Telehealth: Payer: Self-pay | Admitting: Internal Medicine

## 2019-02-27 MED ORDER — EZETIMIBE 10 MG PO TABS: 10.0000 mg | ORAL_TABLET | Freq: Every day | ORAL | 1 refills | Status: DC

## 2019-02-27 NOTE — Telephone Encounter (Signed)
Received Fax RX request from  [name]  Medication - ezetimibe (ZETIA) 10 MG tablet    Last Refill - 11/25/18  Last OV - 02/17/19  Last CPE - 02/17/19

## 2019-08-14 ENCOUNTER — Other Ambulatory Visit: Payer: Self-pay

## 2019-08-14 ENCOUNTER — Other Ambulatory Visit: Payer: 59 | Admitting: Internal Medicine

## 2019-08-14 DIAGNOSIS — E7800 Pure hypercholesterolemia, unspecified: Secondary | ICD-10-CM

## 2019-08-14 DIAGNOSIS — Z131 Encounter for screening for diabetes mellitus: Secondary | ICD-10-CM

## 2019-08-15 LAB — LIPID PANEL
Cholesterol: 205 mg/dL — ABNORMAL HIGH (ref <200)
HDL: 52 mg/dL (ref >=40)
LDL Cholesterol (Calc): 131 mg/dL (calc) — ABNORMAL HIGH
Non-HDL Cholesterol (Calc): 153 mg/dL (calc) — ABNORMAL HIGH (ref <130)
Total CHOL/HDL Ratio: 3.9 (calc) (ref <5.0)
Triglycerides: 109 mg/dL (ref <150)

## 2019-08-15 LAB — HEMOGLOBIN A1C
Hgb A1c MFr Bld: 5.4 % of total Hgb (ref <5.7)
Mean Plasma Glucose: 108 (calc)
eAG (mmol/L): 6 (calc)

## 2019-08-19 ENCOUNTER — Other Ambulatory Visit: Payer: Self-pay

## 2019-08-19 ENCOUNTER — Encounter: Payer: Self-pay | Admitting: Internal Medicine

## 2019-08-19 ENCOUNTER — Ambulatory Visit: Payer: 59 | Admitting: Internal Medicine

## 2019-08-19 VITALS — BP 110/80 | HR 84 | Temp 97.7°F | Ht 69.0 in | Wt 296.0 lb

## 2019-08-19 DIAGNOSIS — Z6841 Body Mass Index (BMI) 40.0 and over, adult: Secondary | ICD-10-CM | POA: Diagnosis not present

## 2019-08-19 DIAGNOSIS — Z789 Other specified health status: Secondary | ICD-10-CM | POA: Diagnosis not present

## 2019-08-19 DIAGNOSIS — E7800 Pure hypercholesterolemia, unspecified: Secondary | ICD-10-CM | POA: Diagnosis not present

## 2019-08-19 NOTE — Progress Notes (Signed)
   Subjective:    Patient ID: Jorge Hughes, male    DOB: 1970-06-23, 49 y.o.   MRN: [id number]  HPI 49 year old Male seen for 91-month follow-up on hyperlipidemia.  He is statin intolerant.  He is on Zetia 10 mg daily.  History of morbid obesity with weight 296 pounds today with BMI of 43.71.    Review of Systems see above-no new complaints     Objective:   Physical Exam Blood pressure is excellent 110/80 weight 296 pounds.  Pulse 84.  Pulse oximetry 98%.  Neck is supple without JVD thyromegaly or carotid bruits.  Chest clear to auscultation.  Cardiac exam regular rate and rhythm normal S1 and S2.  No lower extremity edema.  Hemoglobin A1c stable at 5.4%.  Total cholesterol was 205, HDL 52, triglycerides 109 and LDL 131.  The lowest LDL he had was 1 year ago and was 104.  They not be following diet quite as strictly during the pandemic.  Continue to encourage diet exercise and weight loss.       Assessment & Plan:  Pure hypercholesterolemia-statin intolerant  Morbid obesity-BMI 43.71 continue to work on diet exercise and weight loss  Plan: Continue Zetia 10 mg daily and follow-up in 6 months.  Please continue to work on diet exercise and weight loss.

## 2019-09-14 ENCOUNTER — Encounter: Payer: Self-pay | Admitting: Internal Medicine

## 2020-02-13 ENCOUNTER — Other Ambulatory Visit: Payer: Self-pay

## 2020-02-13 ENCOUNTER — Other Ambulatory Visit: Payer: 59 | Admitting: Internal Medicine

## 2020-02-13 DIAGNOSIS — Z789 Other specified health status: Secondary | ICD-10-CM

## 2020-02-13 DIAGNOSIS — E7800 Pure hypercholesterolemia, unspecified: Secondary | ICD-10-CM

## 2020-02-13 DIAGNOSIS — Z6841 Body Mass Index (BMI) 40.0 and over, adult: Secondary | ICD-10-CM

## 2020-02-13 DIAGNOSIS — Z0000 Encounter for general adult medical examination without abnormal findings: Secondary | ICD-10-CM

## 2020-02-13 LAB — CBC WITH DIFFERENTIAL/PLATELET
Absolute Monocytes: 78 cells/uL — ABNORMAL LOW (ref 200–950)
Basophils Absolute: 20 cells/uL (ref 0–200)
Basophils Relative: 0.7 %
Eosinophils Absolute: 50 cells/uL (ref 15–500)
Eosinophils Relative: 1.8 %
HCT: 45.4 % (ref 38.5–50.0)
Hemoglobin: 15.5 g/dL (ref 13.2–17.1)
Lymphs Abs: 1109 cells/uL (ref 850–3900)
MCH: 33 pg (ref 27.0–33.0)
MCHC: 34.1 g/dL (ref 32.0–36.0)
MCV: 96.6 fL (ref 80.0–100.0)
MPV: 10.4 fL (ref 7.5–12.5)
Monocytes Relative: 2.8 %
Neutro Abs: 1543 cells/uL (ref 1500–7800)
Neutrophils Relative %: 55.1 %
Platelets: 174 10*3/uL (ref 140–400)
RBC: 4.7 10*6/uL (ref 4.20–5.80)
RDW: 13.1 % (ref 11.0–15.0)
Total Lymphocyte: 39.6 %
WBC: 2.8 10*3/uL — ABNORMAL LOW (ref 3.8–10.8)

## 2020-02-13 LAB — COMPLETE METABOLIC PANEL WITH GFR
AG Ratio: 2.5 (calc) (ref 1.0–2.5)
ALT: 27 U/L (ref 9–46)
AST: 22 U/L (ref 10–40)
Albumin: 4.7 g/dL (ref 3.6–5.1)
Alkaline phosphatase (APISO): 57 U/L (ref 36–130)
BUN: 15 mg/dL (ref 7–25)
CO2: 25 mmol/L (ref 20–32)
Calcium: 9.1 mg/dL (ref 8.6–10.3)
Chloride: 104 mmol/L (ref 98–110)
Creat: 0.92 mg/dL (ref 0.60–1.35)
GFR, Est African American: 113 mL/min/{1.73_m2} (ref >=60)
GFR, Est Non African American: 97 mL/min/{1.73_m2} (ref >=60)
Globulin: 1.9 g/dL (calc) (ref 1.9–3.7)
Glucose, Bld: 94 mg/dL (ref 65–99)
Potassium: 4.3 mmol/L (ref 3.5–5.3)
Sodium: 140 mmol/L (ref 135–146)
Total Bilirubin: 0.9 mg/dL (ref 0.2–1.2)
Total Protein: 6.6 g/dL (ref 6.1–8.1)

## 2020-02-13 LAB — LIPID PANEL
Cholesterol: 198 mg/dL (ref <200)
HDL: 58 mg/dL (ref >=40)
LDL Cholesterol (Calc): 118 mg/dL (calc) — ABNORMAL HIGH
Non-HDL Cholesterol (Calc): 140 mg/dL (calc) — ABNORMAL HIGH (ref <130)
Total CHOL/HDL Ratio: 3.4 (calc) (ref <5.0)
Triglycerides: 116 mg/dL (ref <150)

## 2020-02-13 LAB — PSA: PSA: 0.2 ng/mL (ref <=4.0)

## 2020-02-19 ENCOUNTER — Encounter: Payer: 59 | Admitting: Internal Medicine

## 2020-03-11 ENCOUNTER — Other Ambulatory Visit: Payer: Self-pay

## 2020-03-11 ENCOUNTER — Ambulatory Visit: Payer: 59 | Admitting: Internal Medicine

## 2020-03-11 ENCOUNTER — Encounter: Payer: Self-pay | Admitting: Internal Medicine

## 2020-03-11 VITALS — BP 110/70 | HR 88 | Ht 69.0 in | Wt 297.0 lb

## 2020-03-11 DIAGNOSIS — Z789 Other specified health status: Secondary | ICD-10-CM | POA: Diagnosis not present

## 2020-03-11 DIAGNOSIS — Z0000 Encounter for general adult medical examination without abnormal findings: Secondary | ICD-10-CM

## 2020-03-11 DIAGNOSIS — E1169 Type 2 diabetes mellitus with other specified complication: Secondary | ICD-10-CM

## 2020-03-11 DIAGNOSIS — Z6841 Body Mass Index (BMI) 40.0 and over, adult: Secondary | ICD-10-CM | POA: Diagnosis not present

## 2020-03-11 DIAGNOSIS — E7800 Pure hypercholesterolemia, unspecified: Secondary | ICD-10-CM | POA: Diagnosis not present

## 2020-03-11 DIAGNOSIS — E785 Hyperlipidemia, unspecified: Secondary | ICD-10-CM | POA: Diagnosis not present

## 2020-03-11 DIAGNOSIS — D72819 Decreased white blood cell count, unspecified: Secondary | ICD-10-CM | POA: Diagnosis not present

## 2020-03-11 DIAGNOSIS — Z23 Encounter for immunization: Secondary | ICD-10-CM | POA: Diagnosis not present

## 2020-03-11 LAB — POCT URINALYSIS DIPSTICK
Appearance: NEGATIVE
Bilirubin, UA: NEGATIVE
Blood, UA: NEGATIVE
Glucose, UA: NEGATIVE
Ketones, UA: NEGATIVE
Leukocytes, UA: NEGATIVE
Nitrite, UA: NEGATIVE
Odor: NEGATIVE
Protein, UA: NEGATIVE
Spec Grav, UA: 1.015 (ref 1.010–1.025)
Urobilinogen, UA: 0.2 E.U./dL
pH, UA: 6.5 (ref 5.0–8.0)

## 2020-03-11 NOTE — Progress Notes (Signed)
Subjective:    Patient ID: Jorge Hughes, male    DOB: [birth year], 49 y.o.   MRN: [id number]  HPI 49 year old Male for health maintenance exam and evaluation of medical issues.  He has a history of morbid obesity, statin intolerance and hyperlipidemia.  Currently on Zetia 10 mg daily.  At 1 point he lost 20 pounds for the keto diet.  Unfortunately, weighs 297 pounds with BMI of 43.86.  Weight 7 pounds more than he did in August 2020.  Weight is about the same as he did in March 2021 which was 296 pounds.  He is not interested in gastric bypass surgery.  Gets some exercise with his work.  He was tried on Crestor in the past but had concerns with his memory.  He stopped Zocor in 2012 because of memory issues.  He started it in 2010 as total cholesterol was 250 with an LDL cholesterol of 162 with normal triglycerides.  He has an HDL of 58.  With this lab work he was noticed to have white blood cell count of 2800.  We had pathology review his smear and no immature cells were identified.  Noted to have mild leukopenia.  This will be followed.  Last year her white blood cell count was 3109 2019 was 3900.  Generally white blood cell counts have been in the 40,000 range.  We will continue to monitor.  He is allergic to poison ivy.  Had strep throat in 2009.  Left lower pneumonia June 2018.  Has not shown impaired glucose tolerance despite obesity.  No known drug allergies.  Social history: He is married.  He occasionally smokes cigars.  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  Social alcohol consumption.  2 daughters and 1 son.  Family history: Mother with history of breast cancer.  Grandfather with history of CABG.  Grandfather apparently had impaired glucose tolerance as does patient's father.  Father with history of kidney stones.  1 brother and 1 sister.    Review of Systems    Constitutional: Negative.   Respiratory: Negative.   Cardiovascular: Negative.   Gastrointestinal: Negative.   Genitourinary: Negative.   Neurological: Negative.   Psychiatric/Behavioral: Negative.        Objective:   Physical Exam Vitals reviewed.  Constitutional:      Appearance: Normal appearance.  HENT:     Head: Normocephalic and atraumatic.     Right Ear: Tympanic membrane normal.     Left Ear: Tympanic membrane normal.     Nose: Nose normal.  Eyes:     General: No scleral icterus.       Right eye: No discharge.        Left eye: No discharge.     Extraocular Movements: Extraocular movements intact.     Pupils: Pupils are equal, round, and reactive to light.  Neck:     Vascular: No carotid bruit.  Cardiovascular:     Rate and Rhythm: Normal rate and regular rhythm.     Heart sounds: Normal heart sounds. No murmur heard.   Abdominal:     General: Bowel sounds are normal. There is no distension.     Palpations: Abdomen is soft.     Tenderness: There is no right CVA tenderness or guarding.  Musculoskeletal:     Cervical back: Neck supple. No rigidity.     Right lower leg: No  edema.     Left lower leg: No edema.  Skin:    General: Skin is warm and dry.     Findings: No rash.  Neurological:     General: No focal deficit present.     Mental Status: He is alert.  Psychiatric:        Mood and Affect: Mood normal.        Behavior: Behavior normal.        Judgment: Judgment normal.           Assessment & Plan:  Hyperlipidemia-statin intolerant  Lipid panel excellent on Zetia 10 mg daily.  LDL 104.  HDL 50.  Triglycerides 65 and total cholesterol 169.  Continue with Zetia.  Liver functions are normal  Low white blood cell count-pathology review of smear shows mild leukopenia and this will continue to be followed.  BMI 43.86 consistent with morbid obesity.  Recommend diet exercise and weight loss.  Follow-up in 6 months.  He has not been vaccinated for  COVID-19.  Flu vaccine given.

## 2020-03-12 LAB — EXTRA SPECIMEN

## 2020-03-12 LAB — PATHOLOGIST SMEAR REVIEW

## 2020-03-17 NOTE — Patient Instructions (Signed)
Please work on diet exercise and weight loss.  Continue Zetia as prescribed.  Flu vaccine given.  Return in 6 months.

## 2020-03-27 ENCOUNTER — Other Ambulatory Visit: Payer: Self-pay | Admitting: Internal Medicine

## 2020-09-07 ENCOUNTER — Other Ambulatory Visit: Payer: 59 | Admitting: Internal Medicine

## 2020-09-07 ENCOUNTER — Other Ambulatory Visit: Payer: Self-pay

## 2020-09-07 DIAGNOSIS — D72819 Decreased white blood cell count, unspecified: Secondary | ICD-10-CM

## 2020-09-07 DIAGNOSIS — E1169 Type 2 diabetes mellitus with other specified complication: Secondary | ICD-10-CM

## 2020-09-07 DIAGNOSIS — E785 Hyperlipidemia, unspecified: Secondary | ICD-10-CM

## 2020-09-07 DIAGNOSIS — E7800 Pure hypercholesterolemia, unspecified: Secondary | ICD-10-CM

## 2020-09-07 LAB — LIPID PANEL
Cholesterol: 169 mg/dL (ref <200)
HDL: 47 mg/dL (ref >=40)
LDL Cholesterol (Calc): 106 mg/dL (calc) — ABNORMAL HIGH
Non-HDL Cholesterol (Calc): 122 mg/dL (calc) (ref <130)
Total CHOL/HDL Ratio: 3.6 (calc) (ref <5.0)
Triglycerides: 72 mg/dL (ref <150)

## 2020-09-07 LAB — CBC WITH DIFFERENTIAL/PLATELET
Absolute Monocytes: 100 cells/uL — ABNORMAL LOW (ref 200–950)
Basophils Absolute: 10 cells/uL (ref 0–200)
Basophils Relative: 0.4 %
Eosinophils Absolute: 50 cells/uL (ref 15–500)
Eosinophils Relative: 2 %
HCT: 46.4 % (ref 38.5–50.0)
Hemoglobin: 15.9 g/dL (ref 13.2–17.1)
Lymphs Abs: 1063 cells/uL (ref 850–3900)
MCH: 32.6 pg (ref 27.0–33.0)
MCHC: 34.3 g/dL (ref 32.0–36.0)
MCV: 95.3 fL (ref 80.0–100.0)
MPV: 10.8 fL (ref 7.5–12.5)
Monocytes Relative: 4 %
Neutro Abs: 1278 cells/uL — ABNORMAL LOW (ref 1500–7800)
Neutrophils Relative %: 51.1 %
Platelets: 150 10*3/uL (ref 140–400)
RBC: 4.87 10*6/uL (ref 4.20–5.80)
RDW: 12.8 % (ref 11.0–15.0)
Total Lymphocyte: 42.5 %
WBC: 2.5 10*3/uL — ABNORMAL LOW (ref 3.8–10.8)

## 2020-09-09 ENCOUNTER — Encounter: Payer: Self-pay | Admitting: Internal Medicine

## 2020-09-09 ENCOUNTER — Ambulatory Visit: Payer: 59 | Admitting: Internal Medicine

## 2020-09-09 ENCOUNTER — Other Ambulatory Visit: Payer: Self-pay

## 2020-09-09 VITALS — BP 110/80 | HR 85 | Ht 69.0 in | Wt 300.0 lb

## 2020-09-09 DIAGNOSIS — E7800 Pure hypercholesterolemia, unspecified: Secondary | ICD-10-CM | POA: Diagnosis not present

## 2020-09-09 DIAGNOSIS — D72819 Decreased white blood cell count, unspecified: Secondary | ICD-10-CM

## 2020-09-09 DIAGNOSIS — Z789 Other specified health status: Secondary | ICD-10-CM | POA: Diagnosis not present

## 2020-09-09 DIAGNOSIS — Z6841 Body Mass Index (BMI) 40.0 and over, adult: Secondary | ICD-10-CM

## 2020-09-09 MED ORDER — EZETIMIBE 10 MG PO TABS: ORAL_TABLET | ORAL | 1 refills | Status: DC

## 2020-09-09 NOTE — Patient Instructions (Addendum)
Please let us know when you return from [place] and we will make Hematology referral regarding low white blood cell count.  Continue current medications.  Physical exam with fasting labs due in 6 months.  Please take your medications we have previously prescribed for travel with you on your trip to [place].

## 2020-09-09 NOTE — Progress Notes (Signed)
Subjective:    Patient ID: Jorge Hughes, male    DOB: 06-07-1971, 50 y.o.   MRN: [id number]  HPI 50 year old Male seen for 6 month follow up.  He has a history of pure hypercholesterolemia treated with Zetia as he is statin intolerant.  Currently his total cholesterol is normal at 169, HDL 47, triglycerides normal at 72 and LDL is excellent at 106.  In August 2021 his LDL was 118.  In February 2021 his LDL was 131.  He is planning a trip to [place] with his daughter in the near future.  With [place] he is not sure how the trip but he does not.  He had plan to go a couple of years ago just prior to onset of the pandemic and had to cancel his trip.  He does have on hand which we gave him previously prescriptions for Zofran for nausea, Cipro for infection and Xanax for sleep while on the plane.  He will take these with him.  His tetanus immunization is up-to-date.  Has not been vaccinated for COVID-19 according to our records.  Also, we continue to follow his white blood cell count.  It tends to be low.  5 years ago it was normal at 5000 and then 4 years ago it was 3303 years ago it was 3600, 2 years ago it was 3900, 1 year ago it was 3100, 6 months ago 2802 days ago 2500.  He had a path review of his smear and September 2021.  No immature cells were identified.  Red blood cells and platelets were unremarkable.  No reactive changes noted in the myeloid population.  However because the white count is continuing to trend downward, we will make a referral to Hematology once he returns from his trip.  He will contact us regarding this referral when he returns and it is convenient for him.  He has a history of morbid obesity.  He is physically active.  He weighs 300 pounds and his BMI is 44.30.  In September 2021, his BMI was 45.78.  In August 2020.  His BMI was 42.83 and he weighed 290 pounds.  I do not think he is interested in gastric bypass surgery for weight loss.  Review of Systems no new  complaints     Objective:   Physical Exam Blood pressure 110/80 pulse 85 pulse oximetry 95% weight 300 pounds BMI 44.30 spent 20 minutes discussing results of his CBC.  His lipid panel is stable on Zetia.  Would like to see him lose a bit of weight.  Discussed travel medications with him which he will need to take on his trip.       Assessment & Plan:  Travel medicine encounter-he has Zofran, Cipro, Xanax to take with him.  Leukopenia-he has low monocyte count.  When he returns from his trip we will be referring him to Hematology.  He has had previous path review of smear since we have been following his white blood cell count and it was unremarkable.  However, white blood cell count has decreased from 3603 years ago to 2500.  He appears to be asymptomatic.  Does not have easy bruisability or fatigue.  Morbid obesity-continue to work on diet exercise and weight loss  Pure hypercholesterolemia-statin intolerant.  Mild elevation of LDL at 106.  Continue Zetia.  Plan: He will return here in 6 months for health maintenance exam.  We will be making hematology referral when he returns from [place].  He will call us and let us know when he returns.  Time spent revieweing previous white blood cell counts, interviewing patient, making medical recommendations and decision making with regard to low white blood cell count and pure hypercholesterolemia is 30 minutes.  Also included is time spent with travel advice encounter for trip to [place].

## 2020-09-23 ENCOUNTER — Telehealth: Payer: Self-pay | Admitting: Internal Medicine

## 2020-09-23 MED ORDER — ONDANSETRON HCL 4 MG PO TABS: 4.0000 mg | ORAL_TABLET | Freq: Three times a day (TID) | ORAL | 0 refills | Status: DC | PRN

## 2020-09-23 MED ORDER — CIPROFLOXACIN HCL 500 MG PO TABS: 500.0000 mg | ORAL_TABLET | Freq: Two times a day (BID) | ORAL | 0 refills | Status: DC

## 2020-09-23 NOTE — Telephone Encounter (Signed)
Page Laurence Spates [phone]  Page called to say the medications that Ramon Dredge needs sent in for his trip is below  Ciproaflaxin Ondansetron   the 3 rd one was Xanax, but he decided he did not want that one.  [id number] [place], [place] - [address] AT [address] Phone:  [phone]  Fax:  [phone]

## 2020-09-23 NOTE — Telephone Encounter (Signed)
Called patient he said he thought he had some at home but he does not. Will send it in.

## 2020-09-23 NOTE — Telephone Encounter (Signed)
He told me he had all of this at home from before. Jorge Hughes he is traveling soon can you check these and refill

## 2020-10-11 ENCOUNTER — Telehealth: Payer: Self-pay | Admitting: Internal Medicine

## 2020-10-11 NOTE — Telephone Encounter (Signed)
Page Laurence Spates [phone]  Page called to say that Ed is in [place], [place] and has tested positive for COVID and is unable to come home for 12 days. She said he needs a letter, she wanted to know if you could do virtual visit with him and write letter for him. He did a COVID test from a drug store that is positive, he has sniffles, has been told he has to stay at least 12 days.  I ask if he had been to [place], or if he was working with any doctors there. She did not know she is try to help figure away from here to get him and their 4 year old daughter home.

## 2020-10-11 NOTE — Telephone Encounter (Signed)
I have spoken with Page. I think he will need to see a doctor there. It may be difficult to get a letter to [place] even by fax and it just seems best to me given our experience with other patients overseas  with Covid that he see a doctor there to get this letter.

## 2020-11-17 ENCOUNTER — Other Ambulatory Visit: Payer: Self-pay | Admitting: Internal Medicine

## 2021-03-11 ENCOUNTER — Other Ambulatory Visit: Payer: Self-pay

## 2021-03-11 ENCOUNTER — Other Ambulatory Visit: Payer: 59 | Admitting: Internal Medicine

## 2021-03-11 DIAGNOSIS — E7800 Pure hypercholesterolemia, unspecified: Secondary | ICD-10-CM

## 2021-03-11 DIAGNOSIS — E1169 Type 2 diabetes mellitus with other specified complication: Secondary | ICD-10-CM

## 2021-03-11 DIAGNOSIS — Z125 Encounter for screening for malignant neoplasm of prostate: Secondary | ICD-10-CM

## 2021-03-11 DIAGNOSIS — Z130 Encounter for screening for diseases of the blood and blood-forming organs and certain disorders involving the immune mechanism: Secondary | ICD-10-CM

## 2021-03-11 DIAGNOSIS — Z0000 Encounter for general adult medical examination without abnormal findings: Secondary | ICD-10-CM

## 2021-03-11 DIAGNOSIS — E785 Hyperlipidemia, unspecified: Secondary | ICD-10-CM

## 2021-03-11 LAB — CBC WITH DIFFERENTIAL/PLATELET
Absolute Monocytes: 90 cells/uL — ABNORMAL LOW (ref 200–950)
Basophils Absolute: 11 cells/uL (ref 0–200)
Basophils Relative: 0.4 %
Eosinophils Absolute: 62 cells/uL (ref 15–500)
Eosinophils Relative: 2.2 %
HCT: 43.2 % (ref 38.5–50.0)
Hemoglobin: 15.2 g/dL (ref 13.2–17.1)
Lymphs Abs: 1092 cells/uL (ref 850–3900)
MCH: 33 pg (ref 27.0–33.0)
MCHC: 35.2 g/dL (ref 32.0–36.0)
MCV: 93.9 fL (ref 80.0–100.0)
MPV: 10.6 fL (ref 7.5–12.5)
Monocytes Relative: 3.2 %
Neutro Abs: 1546 cells/uL (ref 1500–7800)
Neutrophils Relative %: 55.2 %
Platelets: 157 10*3/uL (ref 140–400)
RBC: 4.6 10*6/uL (ref 4.20–5.80)
RDW: 12.6 % (ref 11.0–15.0)
Total Lymphocyte: 39 %
WBC: 2.8 10*3/uL — ABNORMAL LOW (ref 3.8–10.8)

## 2021-03-11 LAB — COMPREHENSIVE METABOLIC PANEL
AG Ratio: 2.2 (calc) (ref 1.0–2.5)
ALT: 36 U/L (ref 9–46)
AST: 27 U/L (ref 10–35)
Albumin: 4.4 g/dL (ref 3.6–5.1)
Alkaline phosphatase (APISO): 59 U/L (ref 35–144)
BUN: 13 mg/dL (ref 7–25)
CO2: 28 mmol/L (ref 20–32)
Calcium: 9 mg/dL (ref 8.6–10.3)
Chloride: 105 mmol/L (ref 98–110)
Creat: 0.91 mg/dL (ref 0.70–1.30)
Globulin: 2 g/dL (calc) (ref 1.9–3.7)
Glucose, Bld: 92 mg/dL (ref 65–99)
Potassium: 4.4 mmol/L (ref 3.5–5.3)
Sodium: 139 mmol/L (ref 135–146)
Total Bilirubin: 0.8 mg/dL (ref 0.2–1.2)
Total Protein: 6.4 g/dL (ref 6.1–8.1)

## 2021-03-11 LAB — LIPID PANEL
Cholesterol: 196 mg/dL (ref <200)
HDL: 54 mg/dL (ref >=40)
LDL Cholesterol (Calc): 125 mg/dL (calc) — ABNORMAL HIGH
Non-HDL Cholesterol (Calc): 142 mg/dL (calc) — ABNORMAL HIGH (ref <130)
Total CHOL/HDL Ratio: 3.6 (calc) (ref <5.0)
Triglycerides: 77 mg/dL (ref <150)

## 2021-03-11 LAB — PSA: PSA: 0.17 ng/mL (ref <=4.00)

## 2021-03-14 ENCOUNTER — Other Ambulatory Visit: Payer: Self-pay

## 2021-03-14 ENCOUNTER — Encounter: Payer: Self-pay | Admitting: Internal Medicine

## 2021-03-14 ENCOUNTER — Ambulatory Visit (INDEPENDENT_AMBULATORY_CARE_PROVIDER_SITE_OTHER): Payer: 59 | Admitting: Internal Medicine

## 2021-03-14 VITALS — BP 136/74 | HR 87 | Temp 98.8°F | Resp 17 | Ht 69.0 in | Wt 311.0 lb

## 2021-03-14 DIAGNOSIS — Z0000 Encounter for general adult medical examination without abnormal findings: Secondary | ICD-10-CM | POA: Diagnosis not present

## 2021-03-14 DIAGNOSIS — Z23 Encounter for immunization: Secondary | ICD-10-CM | POA: Diagnosis not present

## 2021-03-14 DIAGNOSIS — Z6841 Body Mass Index (BMI) 40.0 and over, adult: Secondary | ICD-10-CM | POA: Diagnosis not present

## 2021-03-14 DIAGNOSIS — Z789 Other specified health status: Secondary | ICD-10-CM

## 2021-03-14 DIAGNOSIS — E7800 Pure hypercholesterolemia, unspecified: Secondary | ICD-10-CM

## 2021-03-14 DIAGNOSIS — D72819 Decreased white blood cell count, unspecified: Secondary | ICD-10-CM

## 2021-03-14 MED ORDER — EZETIMIBE 10 MG PO TABS: ORAL_TABLET | ORAL | 1 refills | Status: DC

## 2021-03-14 NOTE — Patient Instructions (Addendum)
Flu vaccine given.  I am concerned about persistent neutropenia.  I am not sure that it is related to.  He has been to take no other chronic medications.  I would like Hematologist to see you regarding the neutropenia.  I am concerned about her weight.  LDL has increased to 125 from 106.  You tell me you are not tolerant of statin medication due to memory loss.  We may need to consider cardiology evaluation and lipid clinic evaluation.

## 2021-03-14 NOTE — Progress Notes (Signed)
   Subjective:    Patient ID: Jorge Hughes, male    DOB: 1971/02/13, 50 y.o.   MRN: [id number]  HPI 50 year old Male for health maintenance exam and evaluation of medical issues.  He continues to have low blood white blood cell count which has been ongoing for several years.  This is asymptomatic.  White blood cell count is 2800 and was 2506 months ago.  4 years ago it was 3600 then improved to 3900  3years ago.  5 years ago it was 3300.  He does not have frequent respiratory infections.  He did have a mild case of COVID-19 when he was visiting family in [place] several months ago.  He has hyperlipidemia and is statin intolerant.  He is on Zetia.  LDL is increased at 125 and was 106 in March he stopped Zocor in 2012 because of memory issues.  He started it in 2010 his total cholesterol was 250 with an LDL cholesterol of 162.  We had pathology review his smear in 2021 and no immature cells were identified.  He is allergic to poison ivy.  Had strep throat in 2009.  Left lower lobe pneumonia June 2018.  Despite obesity he has not shown impaired glucose tolerance.  No known drug allergies.  Social history: He is married.  He occasionally smokes cigars.  Has a masters degree in Management consultant from [employer].  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  Social alcohol consumption.  2 daughters and 1 son.  Family history: Mother with history of breast cancer.  Grandfather with history of CABG.  Grandfather apparently had impaired glucose tolerance as does patient's father.  Father has history of kidney stones.  1 brother and 1 sister.    Review of Systems denies chest pain, abdominal pain, joint pain     Objective:   Physical Exam weight is 311 pounds temperature 98.8 degrees pulse oximetry 95% pulse 87 regular blood pressure 136/74  Gained 14 pounds since last CPE  Skin warm and dry.  No cervical adenopathy.  No carotid bruits.  No thyromegaly.   Chest is clear.  Cardiac exam: Regular rate and rhythm.  Abdomen is obese soft nondistended without hepatosplenomegaly masses or tenderness.  Prostate is normal without nodules.  Neuro intact without focal deficits.    Assessment & Plan:   Patient reports statin intolerance.  Currently on Zetia but LDL has increased since March 2022.  I am concerned about morbid obesity and hyperlipidemia.  Consider cardiology evaluation.  He has neutropenia which has been present now for several years.  I am not aware of neutropenia being caused by Zetia.  I would like for him to have hematology consultation  He is due for screening colonoscopy but says work is very busy right now and he will let me know when he is ready to do that and he would like to see Dr. Rhea Belton  Talked with him about obesity-he is not willing to consider gastric bypass surgery  Plan: Flu vaccine given.  Referral to hematology regarding neutropenia.  He will let me know when he is ready to do screening colonoscopy.  Encourage diet exercise and weight loss.  For now continue Zetia.  Consider lipid clinic evaluation for best choice of lipid-lowering agent.  I would like for him to have Cardiology evaluation if he is agreeable.

## 2021-03-15 ENCOUNTER — Telehealth: Payer: 59 | Admitting: Internal Medicine

## 2021-03-15 ENCOUNTER — Telehealth: Payer: Self-pay | Admitting: Hematology and Oncology

## 2021-03-15 NOTE — Telephone Encounter (Signed)
Referral placed.

## 2021-03-15 NOTE — Telephone Encounter (Signed)
Called and spoke to Ed about a referral to Cardiology about coronary risk factors with his weight and cholesterol. He is not interested in getting a referral at this time. He will let us know if he changes his mind.

## 2021-03-15 NOTE — Telephone Encounter (Signed)
Scheduled appt per 9/27 referral. Pt is aware of appt date and time. Pt requested a mailed copy of appt. Mailed updated calendar to pt.

## 2021-05-09 NOTE — Progress Notes (Signed)
[facility] CONSULT NOTE  Patient Care Team: Baxley, Cresenciano Lick, MD as PCP - General (Internal Medicine)  CHIEF COMPLAINTS/PURPOSE OF CONSULTATION:  Newly diagnosed neutropenia  HISTORY OF PRESENTING ILLNESS:  Jorge Hughes 50 y.o. male is here because of recent diagnosis of neutropenia. Labs on 03/11/2021 showed WBC 2.8. She presents to the clinic today for initial evaluation and discussion of treatment options.  He reports no frequent infections or illnesses of any kind.  His only issue he tells me is that he is overweight and that he is planning to lose weight.  He does have some arthritis issues.  I reviewed her records extensively and collaborated the history with the patient.  MEDICAL HISTORY:  Past Medical History:  Diagnosis Date   Allergy    Hyperlipidemia     SURGICAL HISTORY: Past Surgical History:  Procedure Laterality Date   WISDOM TOOTH EXTRACTION      SOCIAL HISTORY: Social History   Socioeconomic History   Marital status: Married    Spouse name: Johathon Overturf   Number of children: 3   Years of education: 20   Highest education level: Not on file  Occupational History   Occupation: Clinical biochemist  Tobacco Use   Smoking status: Former    Types: Cigars   Smokeless tobacco: Never  Substance and Sexual Activity   Alcohol use: Yes    Alcohol/week: 2.0 standard drinks    Types: 2 Cans of beer per week    Comment: socially   Drug use: No   Sexual activity: Yes    Partners: Female    Birth control/protection: None  Other Topics Concern   Not on file  Social History Narrative   Lives with his wife and 3 children.     Social Determinants of Health   Financial Resource Strain: Not on file  Food Insecurity: Not on file  Transportation Needs: Not on file  Physical Activity: Not on file  Stress: Not on file  Social Connections: Not on file  Intimate Partner Violence: Not on file    FAMILY HISTORY: Family History  Problem  Relation Age of Onset   Cancer Mother 17       breast   Gallstones Father    Hyperlipidemia Father    Ovarian cysts Sister    Cancer Maternal Grandfather    Diabetes Paternal Grandfather     ALLERGIES:  has No Known Allergies.  MEDICATIONS:  Current Outpatient Medications  Medication Sig Dispense Refill   ciprofloxacin (CIPRO) 500 MG tablet Take 1 tablet (500 mg total) by mouth 2 (two) times daily. (Patient not taking: Reported on 03/14/2021) 20 tablet 0   ezetimibe (ZETIA) 10 MG tablet Take 1 tablet by mouth daily 90 tablet 1   loratadine (CLARITIN) 10 MG tablet Take 10 mg by mouth daily.     ondansetron (ZOFRAN) 4 MG tablet Take 1 tablet (4 mg total) by mouth every 8 (eight) hours as needed for nausea or vomiting. (Patient not taking: Reported on 03/14/2021) 20 tablet 0   No current facility-administered medications for this visit.    REVIEW OF SYSTEMS:   Constitutional: Denies fevers, chills or abnormal night sweats All other systems were reviewed with the patient and are negative.  PHYSICAL EXAMINATION: ECOG PERFORMANCE STATUS: 1 - Symptomatic but completely ambulatory  Vitals:   05/10/21 1559  BP: 140/90  Pulse: 87  Resp: 18  Temp: 97.7 F (36.5 C)  SpO2: 99%   Filed Weights   05/10/21  1559  Weight: (!) 316 lb 8 oz (143.6 kg)   LABORATORY DATA:  I have reviewed the data as listed Lab Results  Component Value Date   WBC 2.8 (L) 03/11/2021   HGB 15.2 03/11/2021   HCT 43.2 03/11/2021   MCV 93.9 03/11/2021   PLT 157 03/11/2021   Lab Results  Component Value Date   NA 139 03/11/2021   K 4.4 03/11/2021   CL 105 03/11/2021   CO2 28 03/11/2021    RADIOGRAPHIC STUDIES: I have personally reviewed the radiological reports and agreed with the findings in the report.  ASSESSMENT AND PLAN:  Leukopenia Lab review: 2018: WBC 3.6, ANC 2000 (rest of the parameters were normal) 03/11/2021: WBC 2.8, ANC 1.54  Differential diagnosis: 1. Infections: Especially  viruses 2. inflammation/autoimmune causes including lupus/rheumatoid arthritis 3. Nutritional causes but B-12   4. Medication induced: I reviewed his medications and there are no medications that can cause neutropenia 5. Bone marrow disorders: Less likely given the fact that he has had this issue for a long time and he is asymptomatic 6.  Ethnicity related/cyclical: Probable  Patient is completely asymptomatic  Workup today: 1. CBC with differential with smear 2. B-12 3. ANA   I will call the patient with the results of the blood work in 1 week Bone marrow biopsy will be considered if he develops multiple cytopenias or if ANC drops below 0.5.   All questions were answered. The patient knows to call the clinic with any problems, questions or concerns.   Rulon Eisenmenger, MD, MPH 05/10/2021    I, Jorge Ates, am acting as scribe for Nicholas Lose, MD.  I have reviewed the above documentation for accuracy and completeness, and I agree with the above.

## 2021-05-10 ENCOUNTER — Other Ambulatory Visit: Payer: Self-pay

## 2021-05-10 ENCOUNTER — Inpatient Hospital Stay: Payer: 59

## 2021-05-10 ENCOUNTER — Inpatient Hospital Stay: Payer: 59 | Attending: Hematology and Oncology | Admitting: Hematology and Oncology

## 2021-05-10 DIAGNOSIS — Z87891 Personal history of nicotine dependence: Secondary | ICD-10-CM | POA: Diagnosis not present

## 2021-05-10 DIAGNOSIS — D72819 Decreased white blood cell count, unspecified: Secondary | ICD-10-CM | POA: Diagnosis present

## 2021-05-10 DIAGNOSIS — E663 Overweight: Secondary | ICD-10-CM | POA: Insufficient documentation

## 2021-05-10 DIAGNOSIS — Z79899 Other long term (current) drug therapy: Secondary | ICD-10-CM | POA: Diagnosis not present

## 2021-05-10 DIAGNOSIS — D709 Neutropenia, unspecified: Secondary | ICD-10-CM

## 2021-05-10 DIAGNOSIS — Z803 Family history of malignant neoplasm of breast: Secondary | ICD-10-CM | POA: Diagnosis not present

## 2021-05-10 LAB — CBC WITH DIFFERENTIAL (CANCER CENTER ONLY)
Abs Immature Granulocytes: 0.01 10*3/uL (ref 0.00–0.07)
Basophils Absolute: 0 10*3/uL (ref 0.0–0.1)
Basophils Relative: 1 %
Eosinophils Absolute: 0.1 10*3/uL (ref 0.0–0.5)
Eosinophils Relative: 2 %
HCT: 45.8 % (ref 39.0–52.0)
Hemoglobin: 15.7 g/dL (ref 13.0–17.0)
Immature Granulocytes: 0 %
Lymphocytes Relative: 39 %
Lymphs Abs: 1.3 10*3/uL (ref 0.7–4.0)
MCH: 32 pg (ref 26.0–34.0)
MCHC: 34.3 g/dL (ref 30.0–36.0)
MCV: 93.5 fL (ref 80.0–100.0)
Monocytes Absolute: 0.2 10*3/uL (ref 0.1–1.0)
Monocytes Relative: 5 %
Neutro Abs: 1.8 10*3/uL (ref 1.7–7.7)
Neutrophils Relative %: 53 %
Platelet Count: 176 10*3/uL (ref 150–400)
RBC: 4.9 MIL/uL (ref 4.22–5.81)
RDW: 12.2 % (ref 11.5–15.5)
WBC Count: 3.4 10*3/uL — ABNORMAL LOW (ref 4.0–10.5)
nRBC: 0 % (ref 0.0–0.2)

## 2021-05-10 LAB — VITAMIN B12: Vitamin B-12: 161 pg/mL — ABNORMAL LOW (ref 180–914)

## 2021-05-10 NOTE — Assessment & Plan Note (Signed)
Lab review: 2018: WBC 3.6, ANC 2000 (rest of the parameters were normal) 03/11/2021: WBC 2.8, ANC 1.54  Differential diagnosis: 1. Infections: Especially viruses like HIV 2. inflammation/autoimmune causes including lupus/rheumatoid arthritis 3. Nutritional causes but T-77 or folic acid deficiency 4. Medication induced 5. Bone marrow disorders 6.  Ethnicity related/cyclical  Workup today: 1. CBC with differential with smear 2. F-61 and folic acid levels 3. ANA   Return to clinic in 2 weeks to discuss the results and consider bone marrow biopsy if necessary I will call the patient with the results of the blood work.

## 2021-05-17 LAB — ANTINUCLEAR ANTIBODIES, IFA: ANA Ab, IFA: NEGATIVE

## 2021-06-20 NOTE — Progress Notes (Signed)
Patient Care Team: Elby Showers, MD as PCP - General (Internal Medicine)  DIAGNOSIS:    ICD-10-CM   1. Neutropenia, unspecified type ([place])  D70.9       CHIEF COMPLIANT: Follow-up of neutropenia  INTERVAL HISTORY: Jorge Hughes is a 51 y.o. with above-mentioned history of neutropenia. She presents to the clinic today for follow-up.  He reports no new problems or concerns.  He is here to review his blood work.  ALLERGIES:  has No Known Allergies.  MEDICATIONS:  Current Outpatient Medications  Medication Sig Dispense Refill   ciprofloxacin (CIPRO) 500 MG tablet Take 1 tablet (500 mg total) by mouth 2 (two) times daily. (Patient not taking: Reported on 03/14/2021) 20 tablet 0   ezetimibe (ZETIA) 10 MG tablet Take 1 tablet by mouth daily 90 tablet 1   loratadine (CLARITIN) 10 MG tablet Take 10 mg by mouth daily.     ondansetron (ZOFRAN) 4 MG tablet Take 1 tablet (4 mg total) by mouth every 8 (eight) hours as needed for nausea or vomiting. (Patient not taking: Reported on 03/14/2021) 20 tablet 0   No current facility-administered medications for this visit.    PHYSICAL EXAMINATION: ECOG PERFORMANCE STATUS: 1 - Symptomatic but completely ambulatory  Vitals:   06/21/21 1008  BP: (!) 142/64  Pulse: 89  Resp: 18  Temp: (!) 97.5 F (36.4 C)  SpO2: 96%   Filed Weights   06/21/21 1008  Weight: (!) 318 lb 12.8 oz (144.6 kg)    LABORATORY DATA:  I have reviewed the data as listed CMP Latest Ref Rng & Units 03/11/2021 02/13/2020 02/14/2019  Glucose 65 - 99 mg/dL 92 94 93  BUN 7 - 25 mg/dL 13 15 17   Creatinine 0.70 - 1.30 mg/dL 0.91 0.92 0.89  Sodium 135 - 146 mmol/L 139 140 141  Potassium 3.5 - 5.3 mmol/L 4.4 4.3 4.4  Chloride 98 - 110 mmol/L 105 104 106  CO2 20 - 32 mmol/L 28 25 27   Calcium 8.6 - 10.3 mg/dL 9.0 9.1 9.2  Total Protein 6.1 - 8.1 g/dL 6.4 6.6 6.4  Total Bilirubin 0.2 - 1.2 mg/dL 0.8 0.9 1.0  Alkaline Phos 40 - 115 U/L - - -  AST 10 - 35 U/L 27 22 23   ALT 9  - 46 U/L 36 27 27    Lab Results  Component Value Date   WBC 3.4 (L) 05/10/2021   HGB 15.7 05/10/2021   HCT 45.8 05/10/2021   MCV 93.5 05/10/2021   PLT 176 05/10/2021   NEUTROABS 1.8 05/10/2021    ASSESSMENT & PLAN:  Leukopenia Lab review: 2018: WBC 3.6, ANC 2000 (rest of the parameters were normal) 03/11/2021: WBC 2.8, ANC 1.54 05/10/2021: WBC 3.4, ANC 1.8, hemoglobin 15.7, platelets 176, B12 161, ANA negative  Differential diagnosis for leukopenia: Benign/cyclical versus related to B12 deficiency Recommendation: 1.  Check for pernicious anemia with antiintrinsic factor and antiparietal cell antibodies 2. discussed pros and cons of oral B12 versus subcutaneous B12 injections. I recommended that he take 5000 micrograms of sublingual B12 replacement therapy.  We decided not to perform the antibody work-up for pernicious anemia at this time.  He will need a recheck of his CBC with B12 levels in 6 months. He would like to do this at his primary care doctor's office.  I will send request to Dr. Renold Genta to add B12 levels to his 47-month follow-up blood work.  No orders of the defined types were placed in this encounter.  The patient has a good understanding of the overall plan. he agrees with it. he will call with any problems that may develop before the next visit here.  Total time spent: 20 mins including face to face time and time spent for planning, charting and coordination of care  Rulon Eisenmenger, MD, MPH 06/21/2021  I, Thana Ates, am acting as scribe for Dr. Nicholas Lose.  I have reviewed the above documentation for accuracy and completeness, and I agree with the above.

## 2021-06-21 ENCOUNTER — Inpatient Hospital Stay: Payer: 59 | Attending: Hematology and Oncology | Admitting: Hematology and Oncology

## 2021-06-21 ENCOUNTER — Other Ambulatory Visit: Payer: Self-pay

## 2021-06-21 DIAGNOSIS — E538 Deficiency of other specified B group vitamins: Secondary | ICD-10-CM | POA: Insufficient documentation

## 2021-06-21 DIAGNOSIS — D709 Neutropenia, unspecified: Secondary | ICD-10-CM | POA: Diagnosis not present

## 2021-06-21 NOTE — Assessment & Plan Note (Signed)
Lab review: 2018: WBC 3.6, ANC 2000 (rest of the parameters were normal) 03/11/2021: WBC 2.8, ANC 1.54 05/10/2021: WBC 3.4, ANC 1.8, hemoglobin 15.7, platelets 176, B12 161, ANA negative  Differential diagnosis for leukopenia: Benign/cyclical versus related to B12 deficiency Recommendation: 1.  Check for pernicious anemia with antiintrinsic factor and antiparietal cell antibodies 2. discussed pros and cons of oral B12 versus subcutaneous B12 injections.

## 2021-09-05 ENCOUNTER — Other Ambulatory Visit: Payer: Self-pay

## 2021-09-05 ENCOUNTER — Other Ambulatory Visit: Payer: 59

## 2021-09-05 DIAGNOSIS — E7800 Pure hypercholesterolemia, unspecified: Secondary | ICD-10-CM

## 2021-09-05 DIAGNOSIS — D72819 Decreased white blood cell count, unspecified: Secondary | ICD-10-CM

## 2021-09-05 LAB — CBC WITH DIFFERENTIAL/PLATELET
Absolute Monocytes: 122 cells/uL — ABNORMAL LOW (ref 200–950)
Basophils Absolute: 19 cells/uL (ref 0–200)
Basophils Relative: 0.5 %
Eosinophils Absolute: 100 cells/uL (ref 15–500)
Eosinophils Relative: 2.7 %
HCT: 45.5 % (ref 38.5–50.0)
Hemoglobin: 16.1 g/dL (ref 13.2–17.1)
Lymphs Abs: 1369 cells/uL (ref 850–3900)
MCH: 33.1 pg — ABNORMAL HIGH (ref 27.0–33.0)
MCHC: 35.4 g/dL (ref 32.0–36.0)
MCV: 93.6 fL (ref 80.0–100.0)
MPV: 10.6 fL (ref 7.5–12.5)
Monocytes Relative: 3.3 %
Neutro Abs: 2091 cells/uL (ref 1500–7800)
Neutrophils Relative %: 56.5 %
Platelets: 184 10*3/uL (ref 140–400)
RBC: 4.86 10*6/uL (ref 4.20–5.80)
RDW: 12.7 % (ref 11.0–15.0)
Total Lymphocyte: 37 %
WBC: 3.7 10*3/uL — ABNORMAL LOW (ref 3.8–10.8)

## 2021-09-05 LAB — LIPID PANEL
Cholesterol: 207 mg/dL — ABNORMAL HIGH (ref <200)
HDL: 58 mg/dL (ref >=40)
LDL Cholesterol (Calc): 124 mg/dL (calc) — ABNORMAL HIGH
Non-HDL Cholesterol (Calc): 149 mg/dL (calc) — ABNORMAL HIGH (ref <130)
Total CHOL/HDL Ratio: 3.6 (calc) (ref <5.0)
Triglycerides: 133 mg/dL (ref <150)

## 2021-09-06 ENCOUNTER — Ambulatory Visit: Payer: 59 | Admitting: Internal Medicine

## 2021-09-06 ENCOUNTER — Encounter: Payer: Self-pay | Admitting: Internal Medicine

## 2021-09-06 VITALS — BP 130/88 | HR 78 | Temp 98.2°F | Ht 69.0 in | Wt 310.5 lb

## 2021-09-06 DIAGNOSIS — D72819 Decreased white blood cell count, unspecified: Secondary | ICD-10-CM | POA: Diagnosis not present

## 2021-09-06 DIAGNOSIS — Z789 Other specified health status: Secondary | ICD-10-CM

## 2021-09-06 DIAGNOSIS — Z6841 Body Mass Index (BMI) 40.0 and over, adult: Secondary | ICD-10-CM

## 2021-09-06 DIAGNOSIS — E7800 Pure hypercholesterolemia, unspecified: Secondary | ICD-10-CM | POA: Diagnosis not present

## 2021-09-06 DIAGNOSIS — E538 Deficiency of other specified B group vitamins: Secondary | ICD-10-CM

## 2021-09-06 MED ORDER — ONDANSETRON HCL 4 MG PO TABS: 4.0000 mg | ORAL_TABLET | Freq: Three times a day (TID) | ORAL | 0 refills | Status: DC | PRN

## 2021-09-06 MED ORDER — SCOPOLAMINE 1 MG/3DAYS TD PT72: 1.0000 | MEDICATED_PATCH | TRANSDERMAL | 12 refills | Status: DC

## 2021-09-06 NOTE — Progress Notes (Signed)
? ?  Subjective:  ? ? Patient ID: Jorge Hughes, male    DOB: 04-06-1971, 51 y.o.   MRN: [id number] ? ?HPI 51 year old Male her for 6 month follow up. He is statin intolerant. He tried it and had significant memory issues. Will not try it again. He saw Dr. Pamelia Hoit in November 2022 regarding  low WBC present for several years (Apprx 5 years) ranging from 3300 5 years ago,  2500 12 months ago, 2800 5 months ago. ? ?He was found to have a low vitamin B12 level of 161 (normal being between 180 and 914).  He was advised to take sublingual B12. ? ?With this visit ,WBC is 3400.  B12 level was not checked with this visit but will be checked in October 2023. ? ?His LDL cholesterol remains moderately elevated at 124.  He is on Zetia.  Total cholesterol is 207, HDL 58, triglycerides 133.  He will not consider starting a statin again even at a low dose. ? ?I spoke with him about a coronary calcium score or even a cardiology evaluation and he will think about it. ? ?He is going deep sea fishing this weekend and would like something for seasickness. ? ? ? ?Review of Systems see above ? ?   ?Objective:  ? Physical Exam ?Blood pressure 130/88 pulse 78 temperature 98.2 degrees pulse oximetry 97%.  Weight 310 pounds 8 ounces BMI 45.85 ? ?Skin: warm and dry, Nodes : none, Neck supple Chest: clear to auscultation. Cor: RRR without ectopy. ? ?   ?Assessment & Plan:  ? ?BMI 45.85-continue to work on diet exercise and weight loss ? ?Mild B12 deficiency.  Currently taking sublingual supplement ? ?Pure hypercholesterolemia treated with Zetia-he is statin intolerant ? ?Medication prescribed for seasickness including Zofran and Transderm Scop olamine patches ? ?Coronary calcium scoring discussed-patient will think about it ? ?Plan: CPE scheduled in 7 months. ?

## 2021-09-06 NOTE — Patient Instructions (Addendum)
Continue B12 supplementation sublingually.  Have prescribed Zofran tablets for nausea and Transderm Scop scopolamine patches for seasickness.  Continue Zetia for hyperlipidemia.  Coronary calcium score discussed and patient will think about it.  He does not want to be on statin medication due to history of memory loss on statin. ? ?Check CBC and B12 level when he returns in 7 months ?

## 2021-09-13 DIAGNOSIS — Z0289 Encounter for other administrative examinations: Secondary | ICD-10-CM

## 2021-10-04 ENCOUNTER — Ambulatory Visit
Admission: RE | Admit: 2021-10-04 | Discharge: 2021-10-04 | Disposition: A | Discharge status: Home / Self Care | Payer: 59 | Source: Ambulatory Visit | Attending: Internal Medicine | Admitting: Internal Medicine

## 2021-10-04 ENCOUNTER — Ambulatory Visit: Payer: 59 | Admitting: Internal Medicine

## 2021-10-04 ENCOUNTER — Encounter: Payer: Self-pay | Admitting: Internal Medicine

## 2021-10-04 ENCOUNTER — Telehealth: Payer: Self-pay | Admitting: Internal Medicine

## 2021-10-04 VITALS — BP 132/80 | HR 72 | Temp 99.2°F

## 2021-10-04 DIAGNOSIS — R051 Acute cough: Secondary | ICD-10-CM

## 2021-10-04 DIAGNOSIS — J22 Unspecified acute lower respiratory infection: Secondary | ICD-10-CM | POA: Diagnosis not present

## 2021-10-04 MED ORDER — CEFTRIAXONE SODIUM 1 G IJ SOLR
1.0000 g | Freq: Once | INTRAMUSCULAR | Status: AC
  Administered 2021-10-04: 1 g via INTRAMUSCULAR

## 2021-10-04 MED ORDER — HYDROCODONE BIT-HOMATROP MBR 5-1.5 MG/5ML PO SOLN: 5.0000 mL | Freq: Three times a day (TID) | ORAL | 0 refills | Status: DC | PRN

## 2021-10-04 NOTE — Telephone Encounter (Signed)
Jorge Hughes ?[phone] ? ?Ed called to say he has chest congestion, ears ringing, coughing up greenish white mucus he first had on Friday sore throat, runny nose, cough, he did COVID test on Sunday that was negative. Schedule him for today at 3:30 ?

## 2021-10-04 NOTE — Progress Notes (Signed)
? ?  Subjective:  ? ? Patient ID: Jorge Hughes, male    DOB: [birth year], 51 y.o.   MRN: [id number] ? ?HPI Seen today for 2-3 day history of cough and chest congestion. No headache. Has malaise and fatigue.Covid test was negative on April 16th.  ? ?Onset of symptoms on April 14 with sore throat, runny nose and cough. ? ?History of LLL pneumonia in 2018 treated with Levaquin, Rocephin and Hycodan. ? ? ? ?He has a history of obesity and statin intolerance. ? ?Review of Systems malaise and fatigue, tinnitus, discolored sputum. ? ?   ?Objective:  ? Physical Exam ?Temperature 99.2 degrees pulse 72 blood pressure 132/80 pulse oximetry 96% ?Skin warm and dry.  Pharynx is clear.  TMs are clear.  Neck is supple without adenopathy. ? ?Has some fine rales in the right lower lobe.  Chest x-ray ordered. ? ? ?   ?Assessment & Plan:  ?Acute lower respiratory infection ? ?Plan: Chest x-ray was obtained and proved to be negative.  He received 1 g IM Rocephin here in the office and was started on Levaquin 500 mg daily for 10 days.  Was given Hycodan 1 teaspoon every 8 hours as needed for cough.  Rest and drink plenty of fluids.  Call if not improving in a few days. ? ?

## 2021-10-05 ENCOUNTER — Telehealth: Payer: Self-pay | Admitting: Internal Medicine

## 2021-10-05 MED ORDER — LEVOFLOXACIN 500 MG PO TABS: 500.0000 mg | ORAL_TABLET | Freq: Every day | ORAL | 0 refills | Status: DC

## 2021-10-05 NOTE — Telephone Encounter (Signed)
Wray Kearns ?[phone] ? ?Ed called to see if results was in on his xray and if he was to get and antibiotic since he got a shot, ?

## 2021-10-05 NOTE — Telephone Encounter (Signed)
LVM that medication was sent in and Xray of lungs was clear ?

## 2021-10-08 NOTE — Patient Instructions (Signed)
Chest x-ray is negative for pneumonia.  Given 1 g IM Rocephin in the office and treated with Levaquin 500 mg daily for 10 days.  Take Hycodan 1 teaspoon every 8 hours as needed for cough.  Rest and drink plenty of fluids.  Call if not improving within a few days. ?

## 2021-11-09 ENCOUNTER — Other Ambulatory Visit: Payer: Self-pay | Admitting: Internal Medicine

## 2022-03-20 ENCOUNTER — Other Ambulatory Visit: Payer: 59

## 2022-03-21 ENCOUNTER — Encounter: Payer: 59 | Admitting: Internal Medicine

## 2022-08-04 NOTE — Progress Notes (Signed)
Patient Care Team: Elby Showers, MD as PCP - General (Internal Medicine)  Visit Date: 08/10/22  Subjective:    Patient ID: Jorge Hughes , Male   DOB: 1971/02/11, 52 y.o.    MRN: [id number]   51 y.o. Male presents today for a comprehensive physical exam. Patient has a past medical history of hyperlipidemia, allergy.  History of hyperlipidemia treated with Zetia 10 mg daily. LDL elevated at 100 on 08/08/22, lipid panel otherwise normal.  History of allergies treated with Claritin 10 mg daily.  Liver, kidney function normal. Glucose, electrolytes, B12 normal. Urine normal. WBC low at 3.4 on 08/08/22.   PSA normal at 0.22 on 08/08/22.  Colonoscopy discussed. Plans to schedule later this year.   Past Medical History:  Diagnosis Date   Allergy    Hyperlipidemia      Family History  Problem Relation Age of Onset   Cancer Mother 23       breast   Gallstones Father    Hyperlipidemia Father    Ovarian cysts Sister    Cancer Maternal Grandfather    Diabetes Paternal Grandfather     Social History   Social History Narrative   Lives with his wife and 3 children.        Review of Systems  Constitutional:  Negative for chills, fever, malaise/fatigue and weight loss.  HENT:  Negative for hearing loss, sinus pain and sore throat.   Respiratory:  Negative for cough and hemoptysis.   Cardiovascular:  Negative for chest pain, palpitations, leg swelling and PND.  Gastrointestinal:  Negative for abdominal pain, constipation, diarrhea, heartburn, nausea and vomiting.  Genitourinary:  Negative for dysuria, frequency and urgency.  Musculoskeletal:  Negative for back pain, myalgias and neck pain.  Skin:  Negative for itching and rash.  Neurological:  Negative for dizziness, tingling, seizures and headaches.  Endo/Heme/Allergies:  Negative for polydipsia.  Psychiatric/Behavioral:  Negative for depression. The patient is not nervous/anxious.         Objective:   Vitals: BP  124/82   Pulse 76   Temp 98.1 F (36.7 C) (Tympanic)   Ht 5' 8.75" (1.746 m)   Wt (!) 323 lb (146.5 kg)   SpO2 98%   BMI 48.05 kg/m    Physical Exam Vitals and nursing note reviewed.  Constitutional:      General: He is awake. He is not in acute distress.    Appearance: Normal appearance. He is not ill-appearing or toxic-appearing.  HENT:     Head: Normocephalic and atraumatic.     Right Ear: Tympanic membrane, ear canal and external ear normal.     Left Ear: Tympanic membrane, ear canal and external ear normal.     Mouth/Throat:     Pharynx: Oropharynx is clear.  Eyes:     Extraocular Movements: Extraocular movements intact.     Pupils: Pupils are equal, round, and reactive to light.  Neck:     Thyroid: No thyroid mass, thyromegaly or thyroid tenderness.     Vascular: No carotid bruit.  Cardiovascular:     Rate and Rhythm: Normal rate and regular rhythm. No extrasystoles are present.    Pulses: Normal pulses.          Dorsalis pedis pulses are 2+ on the right side and 2+ on the left side.       Posterior tibial pulses are 2+ on the right side and 2+ on the left side.     Heart sounds: Normal  heart sounds. No murmur heard.    No friction rub. No gallop.     Comments: Trace to 1+ edema bilaterally. Pulmonary:     Effort: Pulmonary effort is normal.     Breath sounds: Normal breath sounds. No decreased breath sounds, wheezing, rhonchi or rales.  Chest:     Chest wall: No mass.  Abdominal:     Palpations: Abdomen is soft.     Tenderness: There is no abdominal tenderness.     Hernia: No hernia is present.  Genitourinary:    Prostate: Normal. Not enlarged and no nodules present.     Comments: Prostate smooth upon examination. Musculoskeletal:     Cervical back: Normal range of motion.     Right lower leg: Edema present.     Left lower leg: Edema present.  Lymphadenopathy:     Cervical: No cervical adenopathy.     Upper Body:     Right upper body: No supraclavicular  adenopathy.     Left upper body: No supraclavicular adenopathy.  Skin:    General: Skin is warm and dry.  Neurological:     General: No focal deficit present.     Mental Status: He is alert and oriented to person, place, and time. Mental status is at baseline.     Cranial Nerves: Cranial nerves 2-12 are intact.     Sensory: Sensation is intact.     Motor: Motor function is intact.     Coordination: Coordination is intact.     Gait: Gait is intact.     Deep Tendon Reflexes: Reflexes are normal and symmetric.  Psychiatric:        Attention and Perception: Attention normal.        Mood and Affect: Mood normal.        Speech: Speech normal.        Behavior: Behavior normal. Behavior is cooperative.        Thought Content: Thought content normal.        Cognition and Memory: Cognition and memory normal.        Judgment: Judgment normal.       Results:   Studies obtained and personally reviewed by me:   Labs:       Component Value Date/Time   NA 140 08/08/2022 1008   K 4.4 08/08/2022 1008   CL 105 08/08/2022 1008   CO2 28 08/08/2022 1008   GLUCOSE 91 08/08/2022 1008   BUN 14 08/08/2022 1008   CREATININE 0.93 08/08/2022 1008   CALCIUM 8.8 08/08/2022 1008   PROT 6.5 08/08/2022 1008   ALBUMIN 4.4 02/05/2017 1214   AST 24 08/08/2022 1008   ALT 34 08/08/2022 1008   ALKPHOS 71 02/05/2017 1214   BILITOT 0.9 08/08/2022 1008   GFRNONAA 97 02/13/2020 1152   GFRAA 113 02/13/2020 1152     Lab Results  Component Value Date   WBC 3.4 (L) 08/08/2022   HGB 15.6 08/08/2022   HCT 43.8 08/08/2022   MCV 91.1 08/08/2022   PLT 151 08/08/2022    Lab Results  Component Value Date   CHOL 173 08/08/2022   HDL 55 08/08/2022   LDLCALC 100 (H) 08/08/2022   TRIG 89 08/08/2022   CHOLHDL 3.1 08/08/2022    Lab Results  Component Value Date   HGBA1C 5.4 08/14/2019     No results found for: "TSH"   Lab Results  Component Value Date   PSA 0.22 08/08/2022   PSA 0.17 03/11/2021  PSA 0.2 02/13/2020      Assessment & Plan:   Hyperlipidemia: treated with Zetia 10 mg daily. LDL elevated at 100 on 08/08/22, lipid panel otherwise normal.   Morbid Obesity- work on diet exercise and weight loss. BMI is 48. He is not interested in weight loss clinic or gastric bypass surgery for weight loss  Allergic rhinitis: treated with Claritin 10 mg daily.  He plans to get a colonoscopy this year. Refer to Dr. Hilarie Fredrickson.   Vaccine Counseling: UTD on tetanus vaccine. Declines other vaccines.     Return in 1 year. Continue Zetia. Refer to Dr. Hilarie Fredrickson for colonoscopy    I,Alexander Ruley,acting as a scribe for Elby Showers, MD.,have documented all relevant documentation on the behalf of Elby Showers, MD,as directed by  Elby Showers, MD while in the presence of Elby Showers, MD.   I, Elby Showers, MD, have reviewed all documentation for this visit. The documentation on 08/14/22 for the exam, diagnosis, procedures, and orders are all accurate and complete.

## 2022-08-08 ENCOUNTER — Other Ambulatory Visit: Payer: BC Managed Care – PPO

## 2022-08-08 DIAGNOSIS — E538 Deficiency of other specified B group vitamins: Secondary | ICD-10-CM | POA: Diagnosis not present

## 2022-08-08 DIAGNOSIS — Z125 Encounter for screening for malignant neoplasm of prostate: Secondary | ICD-10-CM | POA: Diagnosis not present

## 2022-08-08 DIAGNOSIS — Z136 Encounter for screening for cardiovascular disorders: Secondary | ICD-10-CM

## 2022-08-08 DIAGNOSIS — E7800 Pure hypercholesterolemia, unspecified: Secondary | ICD-10-CM | POA: Diagnosis not present

## 2022-08-09 LAB — LIPID PANEL
Cholesterol: 173 mg/dL (ref <200)
HDL: 55 mg/dL (ref >=40)
LDL Cholesterol (Calc): 100 mg/dL (calc) — ABNORMAL HIGH
Non-HDL Cholesterol (Calc): 118 mg/dL (calc) (ref <130)
Total CHOL/HDL Ratio: 3.1 (calc) (ref <5.0)
Triglycerides: 89 mg/dL (ref <150)

## 2022-08-09 LAB — COMPLETE METABOLIC PANEL WITH GFR
AG Ratio: 2 (calc) (ref 1.0–2.5)
ALT: 34 U/L (ref 9–46)
AST: 24 U/L (ref 10–35)
Albumin: 4.3 g/dL (ref 3.6–5.1)
Alkaline phosphatase (APISO): 69 U/L (ref 35–144)
BUN: 14 mg/dL (ref 7–25)
CO2: 28 mmol/L (ref 20–32)
Calcium: 8.8 mg/dL (ref 8.6–10.3)
Chloride: 105 mmol/L (ref 98–110)
Creat: 0.93 mg/dL (ref 0.70–1.30)
Globulin: 2.2 g/dL (calc) (ref 1.9–3.7)
Glucose, Bld: 91 mg/dL (ref 65–99)
Potassium: 4.4 mmol/L (ref 3.5–5.3)
Sodium: 140 mmol/L (ref 135–146)
Total Bilirubin: 0.9 mg/dL (ref 0.2–1.2)
Total Protein: 6.5 g/dL (ref 6.1–8.1)
eGFR: 99 mL/min/{1.73_m2} (ref >=60)

## 2022-08-09 LAB — CBC WITH DIFFERENTIAL/PLATELET
Absolute Monocytes: 139 cells/uL — ABNORMAL LOW (ref 200–950)
Basophils Absolute: 20 cells/uL (ref 0–200)
Basophils Relative: 0.6 %
Eosinophils Absolute: 99 cells/uL (ref 15–500)
Eosinophils Relative: 2.9 %
HCT: 43.8 % (ref 38.5–50.0)
Hemoglobin: 15.6 g/dL (ref 13.2–17.1)
Lymphs Abs: 1102 cells/uL (ref 850–3900)
MCH: 32.4 pg (ref 27.0–33.0)
MCHC: 35.6 g/dL (ref 32.0–36.0)
MCV: 91.1 fL (ref 80.0–100.0)
MPV: 10.5 fL (ref 7.5–12.5)
Monocytes Relative: 4.1 %
Neutro Abs: 2040 cells/uL (ref 1500–7800)
Neutrophils Relative %: 60 %
Platelets: 151 10*3/uL (ref 140–400)
RBC: 4.81 10*6/uL (ref 4.20–5.80)
RDW: 12.5 % (ref 11.0–15.0)
Total Lymphocyte: 32.4 %
WBC: 3.4 10*3/uL — ABNORMAL LOW (ref 3.8–10.8)

## 2022-08-09 LAB — PSA: PSA: 0.22 ng/mL (ref <=4.00)

## 2022-08-09 LAB — VITAMIN B12: Vitamin B-12: >2000 pg/mL — ABNORMAL HIGH (ref 200–1100)

## 2022-08-10 ENCOUNTER — Ambulatory Visit (INDEPENDENT_AMBULATORY_CARE_PROVIDER_SITE_OTHER): Payer: BC Managed Care – PPO | Admitting: Internal Medicine

## 2022-08-10 ENCOUNTER — Other Ambulatory Visit: Payer: Self-pay | Admitting: Internal Medicine

## 2022-08-10 ENCOUNTER — Encounter: Payer: Self-pay | Admitting: Internal Medicine

## 2022-08-10 VITALS — BP 124/82 | HR 76 | Temp 98.1°F | Ht 68.75 in | Wt 323.0 lb

## 2022-08-10 DIAGNOSIS — Z789 Other specified health status: Secondary | ICD-10-CM

## 2022-08-10 DIAGNOSIS — Z0000 Encounter for general adult medical examination without abnormal findings: Secondary | ICD-10-CM | POA: Diagnosis not present

## 2022-08-10 DIAGNOSIS — Z6841 Body Mass Index (BMI) 40.0 and over, adult: Secondary | ICD-10-CM

## 2022-08-10 DIAGNOSIS — E7800 Pure hypercholesterolemia, unspecified: Secondary | ICD-10-CM | POA: Diagnosis not present

## 2022-08-10 LAB — POCT URINALYSIS DIPSTICK
Bilirubin, UA: NEGATIVE
Blood, UA: NEGATIVE
Glucose, UA: NEGATIVE
Ketones, UA: NEGATIVE
Leukocytes, UA: NEGATIVE
Nitrite, UA: NEGATIVE
Protein, UA: NEGATIVE
Spec Grav, UA: 1.015 (ref 1.010–1.025)
Urobilinogen, UA: 0.2 E.U./dL
pH, UA: 6.5 (ref 5.0–8.0)

## 2022-08-14 NOTE — Patient Instructions (Signed)
Continue Zetia for hyperlipidemia.  He is statin intolerant.  Continue Claritin as needed for allergic rhinitis.  Refer to Dr. Hilarie Fredrickson for colonoscopy.  Vaccine is discussed.  He is up-to-date on tetanus vaccine and declines other vaccines.  Return here in 1 year for health maintenance exam and fasting labs or as needed.

## 2022-08-25 IMAGING — CR DG CHEST 2V
2 series · 2 of 2 positions shown · non-contrast
Comparison: Chest two views 12/05/2016

CLINICAL DATA: Cough and rales right lower lobe. Midline chest pain
for 4 days. History of pneumonia.

EXAM:
CHEST - 2 VIEW

[w chest pa]
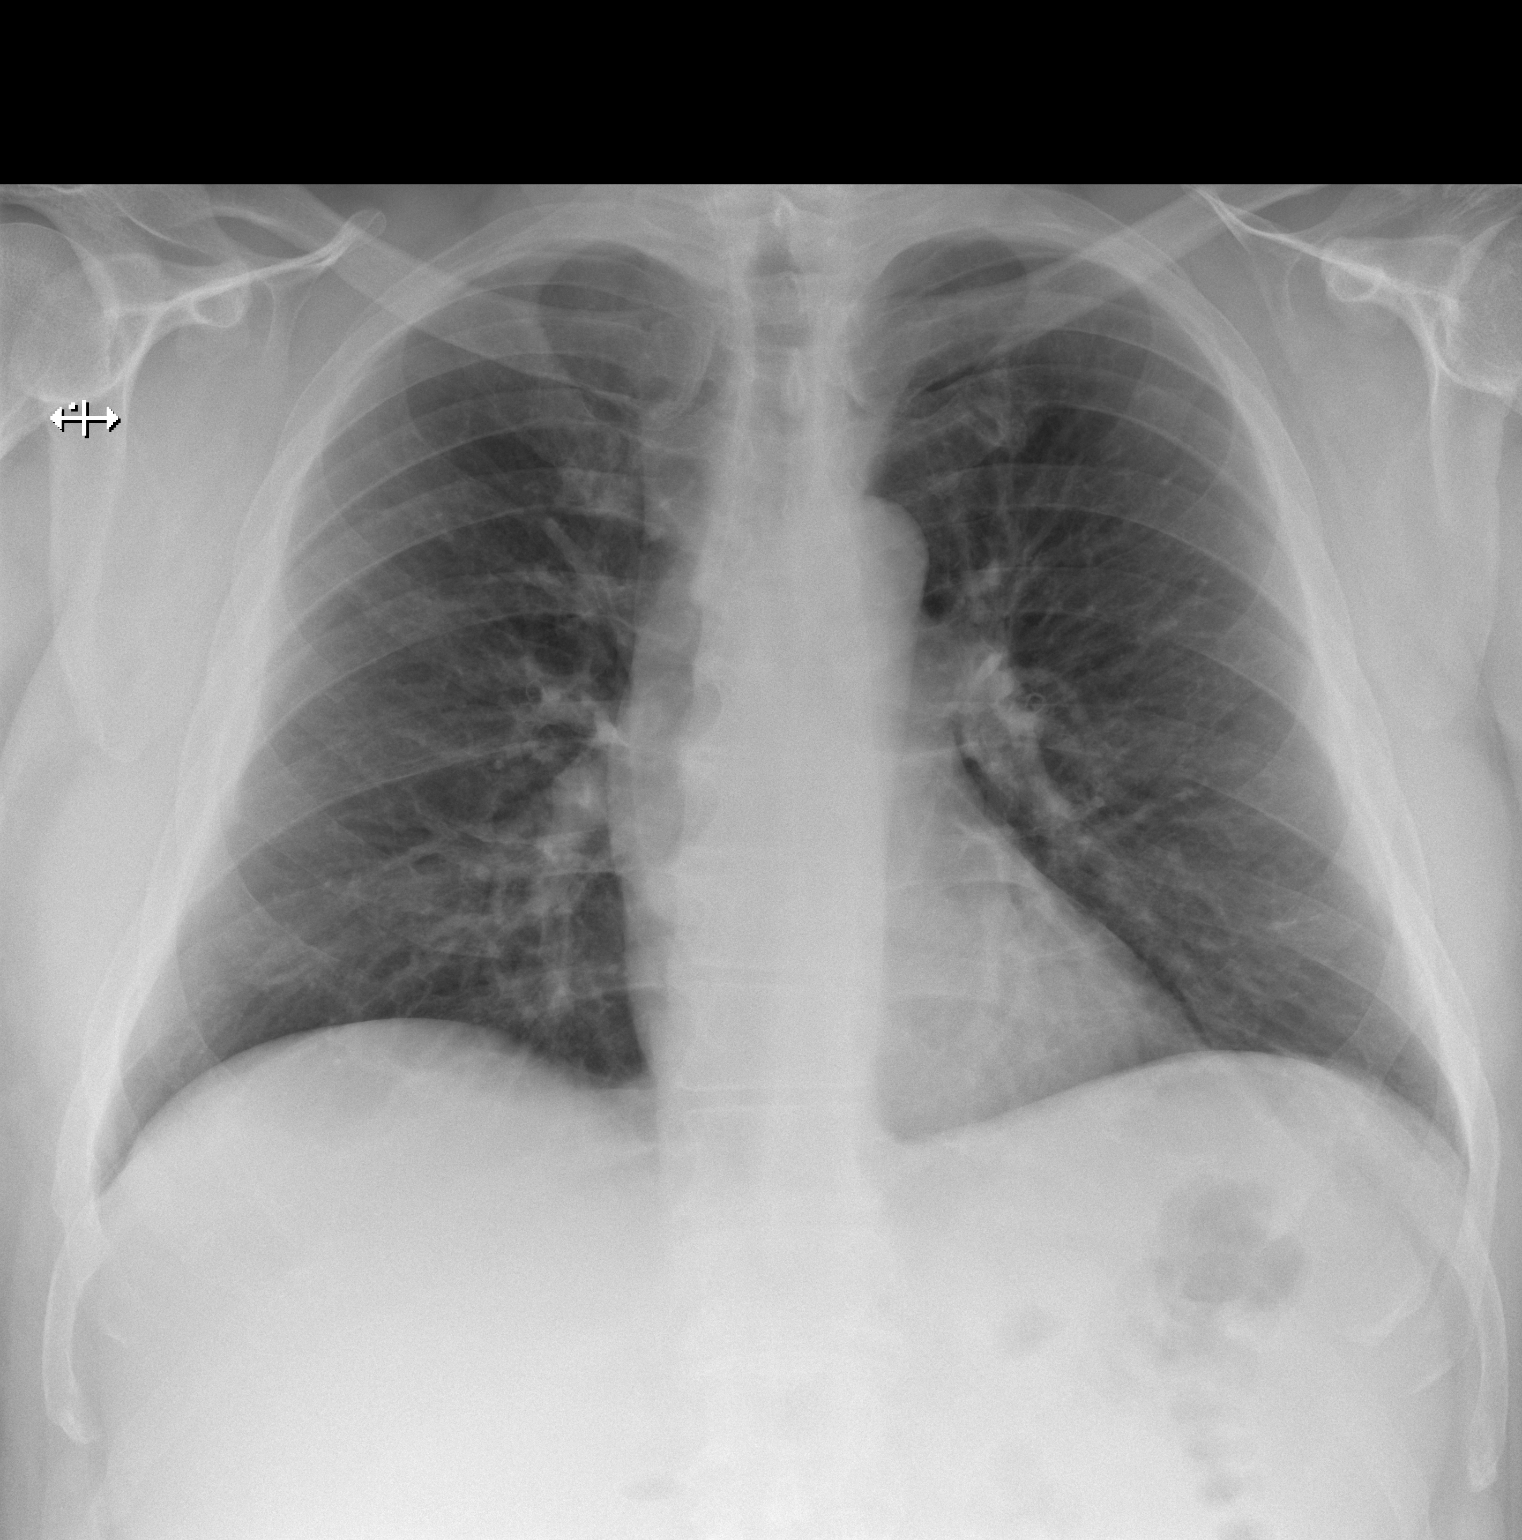

[w chest lat]
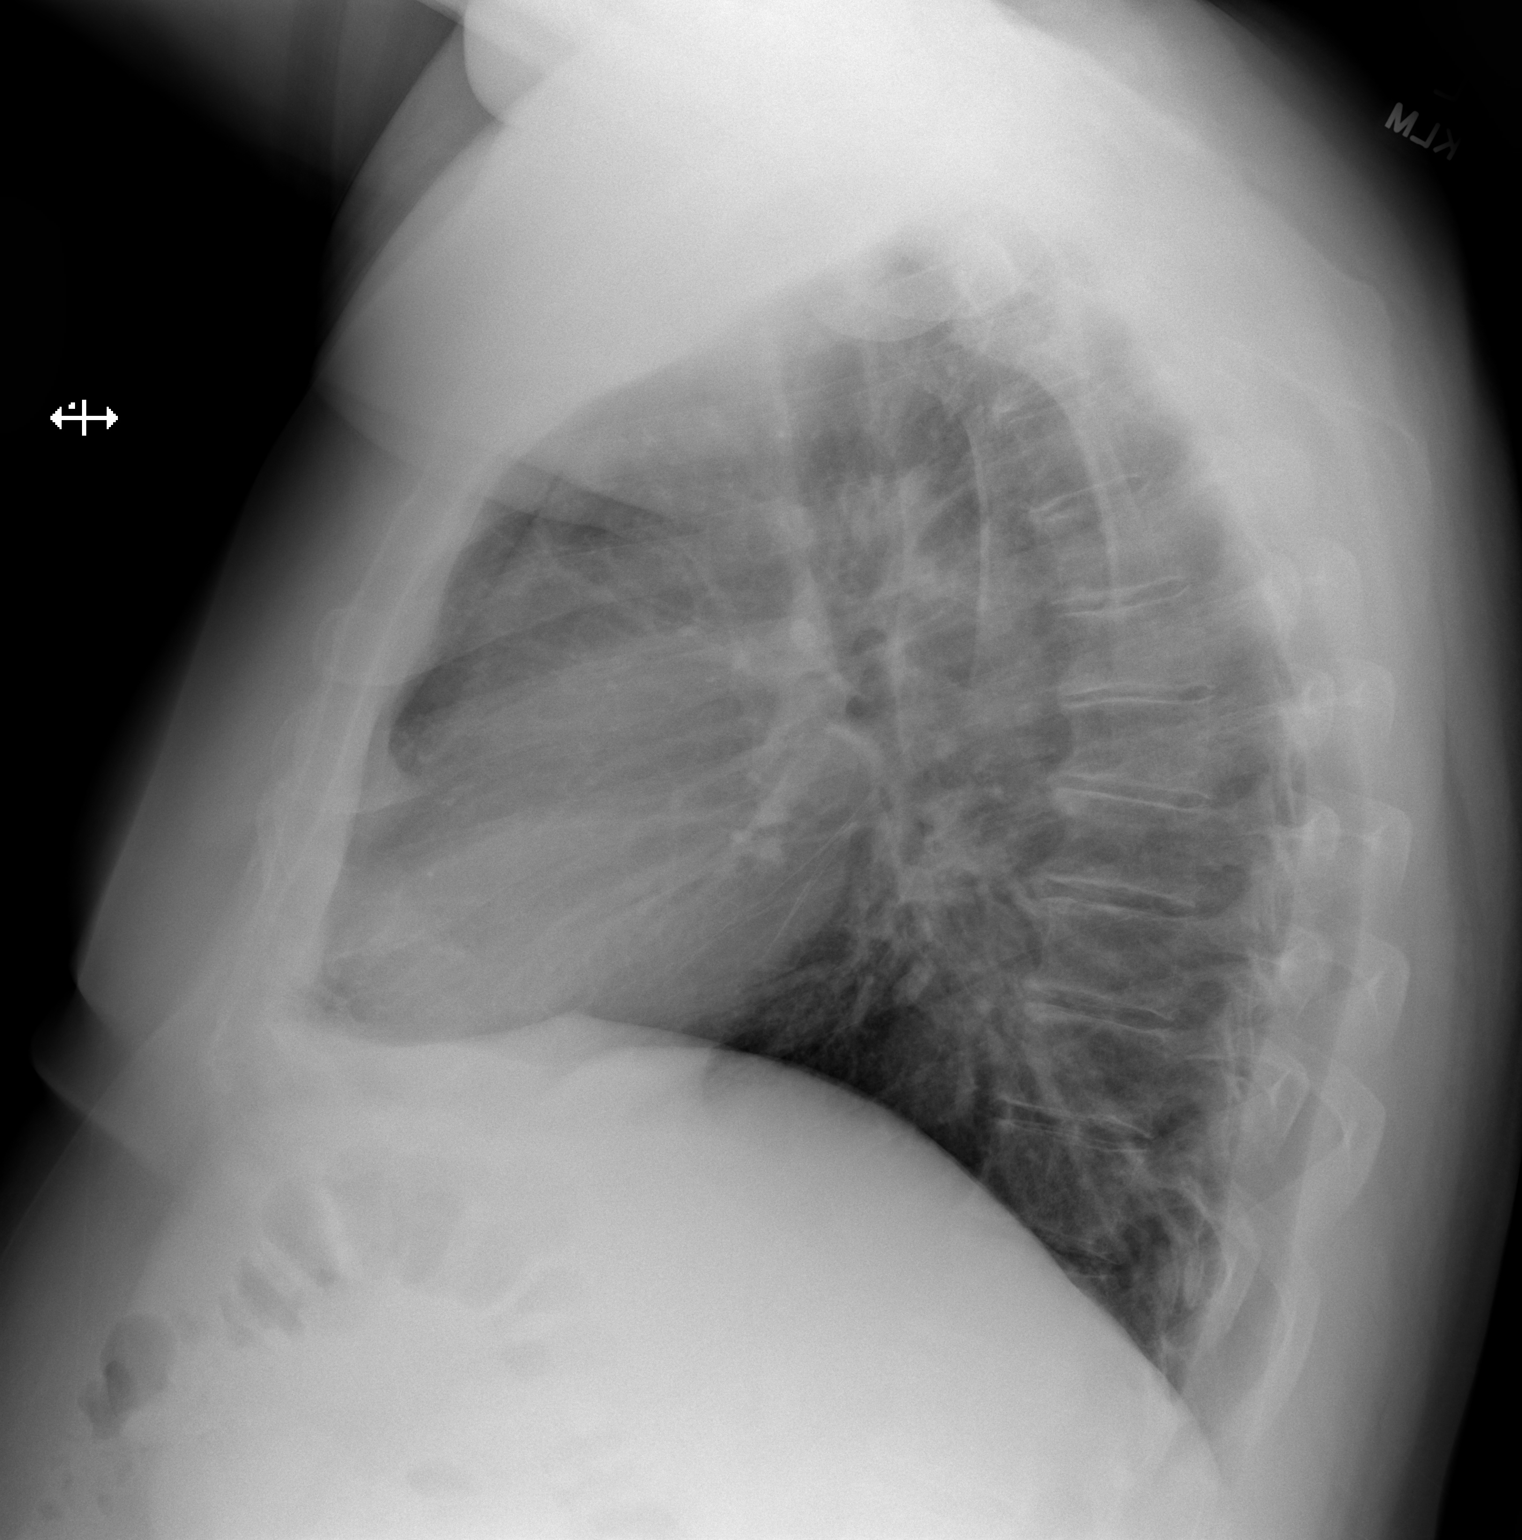

[2 of 2 positions shown; findings below may reference images not displayed]

FINDINGS: Cardiac silhouette and mediastinal contours are within normal
limits. The lungs are clear. No pleural effusion or pneumothorax.
Mild multilevel degenerative disc changes of the thoracic spine.
IMPRESSION: No active cardiopulmonary disease.

## 2023-03-23 ENCOUNTER — Other Ambulatory Visit: Payer: Self-pay | Admitting: Internal Medicine

## 2023-03-23 DIAGNOSIS — Z1212 Encounter for screening for malignant neoplasm of rectum: Secondary | ICD-10-CM

## 2023-03-23 DIAGNOSIS — Z1211 Encounter for screening for malignant neoplasm of colon: Secondary | ICD-10-CM

## 2023-03-31 ENCOUNTER — Other Ambulatory Visit: Payer: Self-pay | Admitting: Internal Medicine

## 2023-05-31 ENCOUNTER — Ambulatory Visit: Payer: Self-pay | Admitting: Internal Medicine

## 2023-05-31 NOTE — Telephone Encounter (Addendum)
LVM for patient to CB, concerned about patient waiting till Friday afternoon to come in if toe is swelling and bleeding. Trying to see if we can get him in today or earlier tomorrow.

## 2023-05-31 NOTE — Telephone Encounter (Signed)
  Chief Complaint: Toe Pain Symptoms: swelling, pain and fluid coming from nailbed Frequency: patient states has been going on for the last month-pain increased over the last several days.  Pertinent Negatives: Patient denies fever, numbness Disposition: [] ED /[] Urgent Care (no appt availability in office) / [x] Appointment(In office/virtual)/ []  [facility]/ [] Home Care/ [] Refused Recommended Disposition /[] [facility]/ []  Follow-up with PCP Additional Notes: patient called with pain to left big toe. Patient states that he cleaned under his toe and then developed pain along with swelling and blood coming from toe. Per protocol, appointment made with PCP for 06/01/2023 at 4:00pm. Care Advice given. Patient verbalizes understanding of plan and all questions answered.    Copied from CRM [id number]. Topic: Appointments - Appointment Scheduling >> May 31, 2023 11:44 AM Louie Casa B wrote: Patient/patient representative is calling to schedule an appointment. He has swelling in his big toe pain as well as bleeding Refer to attachments for appointment information. Reason for Disposition  [1] Swollen toe AND [2] no fever  (Exceptions: Just a localized bump from bunion, corns, insect bite, sting.)  Answer Assessment - Initial Assessment Questions 1. ONSET: "When did the pain start?"      Pain started a month and a half ago 2. LOCATION: "Where is the pain located?"   (e.g., around nail, entire toe, at foot joint)      Left Big Toe 3. PAIN: "How bad is the pain?"    (Scale 1-10; or mild, moderate, severe)   -  MILD (1-3): doesn't interfere with normal activities    -  MODERATE (4-7): interferes with normal activities (e.g., work or school) or awakens from sleep, limping    -  SEVERE (8-10): excruciating pain, unable to do any normal activities, unable to walk     Pain between a 4 and 5 4. APPEARANCE: "What does the toe look like?" (e.g., redness, swelling, bruising, pallor)     Swelling  with a little redness and will ooze fluid and blood from where the toenail and nail bed meets it.  5. CAUSE: "What do you think is causing the toe pain?"     Patient states that he had cleaned under his toenail 6. OTHER SYMPTOMS: "Do you have any other symptoms?" (e.g., leg pain, rash, fever, numbness)     No  Protocols used: Toe Pain-A-AH

## 2023-05-31 NOTE — Telephone Encounter (Signed)
Copied from CRM [id number]. Topic: Appointments - Scheduling Inquiry for Clinic >> May 31, 2023  2:12 PM Lorin Glass B wrote: Reason for CRM: Patient missed call regarding toe injury appt and called back for [facility]. States he is unable to come in today for appt as she offered but tomorrow morning is available. Tomorrow morning unavailable on our end to schedule. Says Burna Mortimer can call him back now or anytime to discuss scheduling him for tomorrow morning. Callback [phone]

## 2023-05-31 NOTE — Telephone Encounter (Signed)
Patient is coming in earlier in the day on Friday

## 2023-06-01 ENCOUNTER — Ambulatory Visit: Payer: BC Managed Care – PPO | Admitting: Internal Medicine

## 2023-06-01 ENCOUNTER — Encounter: Payer: Self-pay | Admitting: Internal Medicine

## 2023-06-01 VITALS — BP 120/80 | HR 82 | Temp 98.4°F | Ht 68.75 in

## 2023-06-01 DIAGNOSIS — L600 Ingrowing nail: Secondary | ICD-10-CM

## 2023-06-01 DIAGNOSIS — Z23 Encounter for immunization: Secondary | ICD-10-CM | POA: Diagnosis not present

## 2023-06-01 MED ORDER — DOXYCYCLINE HYCLATE 100 MG PO TABS: 100.0000 mg | ORAL_TABLET | Freq: Two times a day (BID) | ORAL | 0 refills | Status: DC

## 2023-06-01 NOTE — Patient Instructions (Signed)
Take Doxycycline 100 mg twice daily for 7 days. Soak tor in warm soapy water for 20 minutes twice daily. Tetanus update given. Referral to Podiatry to assess toe nail.

## 2023-06-01 NOTE — Progress Notes (Signed)
Patient Care Team: Margaree Mackintosh, MD as PCP - General (Internal Medicine)  Visit Date: 06/01/23  Subjective:    Patient ID: Jorge Hughes , Male   DOB: 1971/04/18, 52 y.o.    MRN: [id number]   52 y.o. Male presents today for acute left toe pain. His left great toe is swollen, with an ingrown toe nail that has been draining purulently with some blood. He has been cleaning it out in his morning showers. Endorses tenderness on the distal aspect of his greater toe. 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 Denies any known interactions with antibiotics. Tetanus UTD.  He reports that he will have his colonoscopy performed after Christmas.   Past Medical History:  Diagnosis Date   Allergy    Hyperlipidemia      Family History  Problem Relation Age of Onset   Cancer Mother 35       breast   Gallstones Father    Hyperlipidemia Father    Ovarian cysts Sister    Cancer Maternal Grandfather    Diabetes Paternal Grandfather     Social History   Social History Narrative   Lives with his wife and 3 children.        Review of Systems  Constitutional:  Negative for fever and malaise/fatigue.  HENT:  Negative for congestion.   Eyes:  Negative for blurred vision.  Respiratory:  Negative for cough and shortness of breath.   Cardiovascular:  Negative for chest pain, palpitations and leg swelling.  Gastrointestinal:  Negative for vomiting.  Musculoskeletal:  Negative for back pain.  Skin:  Negative for rash.       (+) Tenderness to distal aspect of left greater toe  Neurological:  Negative for loss of consciousness and headaches.        Objective:   Vitals: BP 120/80   Pulse 82   Temp 98.4 F (36.9 C)   Ht 5' 8.75" (1.746 m)   SpO2 97%   BMI 48.05 kg/m    Physical Exam Vitals and nursing note reviewed.  Constitutional:      General: He is not in acute distress.    Appearance: Normal appearance. He is  not ill-appearing.  HENT:     Head: Normocephalic and atraumatic.  Pulmonary:     Effort: Pulmonary effort is normal.  Feet:     Comments: Swollen left greater toe with an ingrown toe nail having some purulent and bloody drainage from the medial aspect Paronychia Bunion Skin:    General: Skin is warm and dry.  Neurological:     Mental Status: He is alert and oriented to person, place, and time. Mental status is at baseline.  Psychiatric:        Mood and Affect: Mood normal.        Behavior: Behavior normal.        Thought Content: Thought content normal.        Judgment: Judgment normal.       Results:   Studies obtained and personally reviewed by me:   Labs:       Component Value Date/Time   NA 140 08/08/2022 1008   K 4.4 08/08/2022 1008   CL 105 08/08/2022 1008   CO2 28 08/08/2022 1008   GLUCOSE 91 08/08/2022 1008   BUN 14 08/08/2022 1008   CREATININE 0.93 08/08/2022 1008   CALCIUM 8.8 08/08/2022 1008   PROT 6.5  08/08/2022 1008   ALBUMIN 4.4 02/05/2017 1214   AST 24 08/08/2022 1008   ALT 34 08/08/2022 1008   ALKPHOS 71 02/05/2017 1214   BILITOT 0.9 08/08/2022 1008   GFRNONAA 97 02/13/2020 1152   GFRAA 113 02/13/2020 1152     Lab Results  Component Value Date   WBC 3.4 (L) 08/08/2022   HGB 15.6 08/08/2022   HCT 43.8 08/08/2022   MCV 91.1 08/08/2022   PLT 151 08/08/2022    Lab Results  Component Value Date   CHOL 173 08/08/2022   HDL 55 08/08/2022   LDLCALC 100 (H) 08/08/2022   TRIG 89 08/08/2022   CHOLHDL 3.1 08/08/2022    Lab Results  Component Value Date   HGBA1C 5.4 08/14/2019     No results found for: "TSH"   Lab Results  Component Value Date   PSA 0.22 08/08/2022   PSA 0.17 03/11/2021   PSA 0.2 02/13/2020      Assessment & Plan:    Infected Ingrown Toenail (Left) great toe. Prescribed 100 mg Doxycycline, take 2 times daily. CMA cleaned toe  with Hibiclens solution in warm water, mupirocin applied,  toe was wrapped. Advised to  soak left great  toe in warm soapy water for 20 minutes x 7-10  days. Referral placed for Podiatry.   Tdap given today    I,Alexander Ruley,acting as a scribe for Margaree Mackintosh, MD.,have documented all relevant documentation on the behalf of Margaree Mackintosh, MD,as directed by  Margaree Mackintosh, MD while in the presence of Margaree Mackintosh, MD.   I, Margaree Mackintosh, MD, have reviewed all documentation for this visit. The documentation on 06/01/23 for the exam, diagnosis, procedures, and orders are all accurate and complete.

## 2023-06-07 ENCOUNTER — Ambulatory Visit: Payer: BC Managed Care – PPO | Admitting: Podiatry

## 2023-06-07 ENCOUNTER — Encounter: Payer: Self-pay | Admitting: Podiatry

## 2023-06-07 DIAGNOSIS — L03032 Cellulitis of left toe: Secondary | ICD-10-CM | POA: Diagnosis not present

## 2023-06-07 DIAGNOSIS — M7731 Calcaneal spur, right foot: Secondary | ICD-10-CM | POA: Diagnosis not present

## 2023-06-07 DIAGNOSIS — L089 Local infection of the skin and subcutaneous tissue, unspecified: Secondary | ICD-10-CM | POA: Diagnosis not present

## 2023-06-07 NOTE — Progress Notes (Signed)
Subjective:   Patient ID: Jorge Hughes, male   DOB: 52 y.o.   MRN: [id number]   HPI Chief Complaint  Patient presents with   Ingrown Toenail    RM#12 Left big toe ingrown previously infected and is being treated currently by PCP .    52 year old male presents the office with above concerns.  He states that in October he tried to clean the corner of the toenail started to bleed.  He is not on antibiotics.  He has been using soapy water soaks.  Still has tenderness of the nail border.    There is a secondary concerns of pain to the posterior aspect of the heel on the right side.  Previously had a heel spur.  No recent injuries.  Review of Systems  All other systems reviewed and are negative.  Past Medical History:  Diagnosis Date   Allergy    Hyperlipidemia     Past Surgical History:  Procedure Laterality Date   WISDOM TOOTH EXTRACTION       Current Outpatient Medications:    doxycycline (VIBRA-TABS) 100 MG tablet, Take 1 tablet (100 mg total) by mouth 2 (two) times daily., Disp: 14 tablet, Rfl: 0   ezetimibe (ZETIA) 10 MG tablet, TAKE 1 TABLET BY MOUTH DAILY, Disp: 90 tablet, Rfl: 1   loratadine (CLARITIN) 10 MG tablet, Take 10 mg by mouth as needed., Disp: , Rfl:    meloxicam (MOBIC) 15 MG tablet, Take 15 mg by mouth as needed., Disp: , Rfl:   No Known Allergies        Objective:  Physical Exam  General: AAO x3, NAD  Dermatological: Incurvation present to the left medial hallux toenail with localized edema and erythema and small Mehta purulence identified.  There is no ascending cellulitis.  No fluctuation or crepitation.  No malodor.  No open lesions.  Vascular: Dorsalis Pedis artery and Posterior Tibial artery pedal pulses are 2/4 bilateral with immedate capillary fill time.  There is no pain with calf compression, swelling, warmth, erythema.   Neruologic: Grossly intact via light touch bilateral.  Musculoskeletal: Tenderness to the left hallux medial nail  border.  He also gets discomfort on the posterior aspect right heel and insertion Achilles tendon on the area of prominence.  There is no pain without compression of calcaneus.  Achilles tendon appears to be intact.  No edema.  MMT 5/5.  Gait: Unassisted, Nonantalgic.       Assessment:   Left hallux paronychia; heel pain right     Plan:  -Treatment options discussed including all alternatives, risks, and complications -Etiology of symptoms were discussed At this time, recommended partial nail removal, I&D without chemical matricectomy to the left medial due to infection. Risks and complications were discussed with the patient for which they understand and  verbally consent to the procedure. Under sterile conditions a total of 3 mL of a mixture of 2% lidocaine plain and 0.5% Marcaine plain was infiltrated in a hallux block fashion. Once anesthetized, the skin was prepped in sterile fashion. A tourniquet was then applied. Next the medial border of the hallux nail border was sharply excised making sure to remove the entire offending nail border. Once the nail was  Removed, the area was debrided and the underlying skin was intact. The area was irrigated and hemostasis was obtained.  A dry sterile dressing was applied. After application of the dressing the tourniquet was removed and there is found to be an immediate capillary refill time to  the digit. The patient tolerated the procedure well any complications. Post procedure instructions were discussed the patient for which he verbally understood. Follow-up in one week for nail check or sooner if any problems are to arise. Discussed signs/symptoms of worsening infection and directed to call the office immediately should any occur or go directly to the emergency room. In the meantime, encouraged to call the office with any questions, concerns, changes symptoms. -Wound culture obtained  -Doxycyline  -Regards to the heel pain recommend shoes, good arch  support.  Heel lift as needed.  Stretching, icing regularly.  Consider physical therapy.     Return in about 2 weeks (around 06/21/2023), or if symptoms worsen or fail to improve, for nail check.  Vivi Barrack DPM

## 2023-06-07 NOTE — Patient Instructions (Signed)
Soak Instructions    THE DAY AFTER THE PROCEDURE  Place 1/4 cup of epsom salts in a quart of warm tap water.  Submerge your foot or feet with outer bandage intact for the initial soak; this will allow the bandage to become moist and wet for easy lift off.  Once you remove your bandage, continue to soak in the solution for 20 minutes.  This soak should be done twice a day.  Next, remove your foot or feet from solution, blot dry the affected area and cover.  You may use a band aid large enough to cover the area or use gauze and tape.  Apply other medications to the area as directed by the doctor such as polysporin neosporin.  IF YOUR SKIN BECOMES IRRITATED WHILE USING THESE INSTRUCTIONS, IT IS OKAY TO SWITCH TO  WHITE VINEGAR AND WATER. Or you may use antibacterial soap and water to keep the toe clean  Monitor for any signs/symptoms of infection. Call the office immediately if any occur or go directly to the emergency room. Call with any questions/concerns.   --  Achilles Tendinitis  with Rehab Achilles tendinitis is a disorder of the Achilles tendon. The Achilles tendon connects the large calf muscles (Gastrocnemius and Soleus) to the heel bone (calcaneus). This tendon is sometimes called the heel cord. It is important for pushing-off and standing on your toes and is important for walking, running, or jumping. Tendinitis is often caused by overuse and repetitive microtrauma. SYMPTOMS Pain, tenderness, swelling, warmth, and redness may occur over the Achilles tendon even at rest. Pain with pushing off, or flexing or extending the ankle. Pain that is worsened after or during activity. CAUSES  Overuse sometimes seen with rapid increase in exercise programs or in sports requiring running and jumping. Poor physical conditioning (strength and flexibility or endurance). Running sports, especially training running down hills. Inadequate warm-up before practice or play or failure to stretch before  participation. Injury to the tendon. PREVENTION  Warm up and stretch before practice or competition. Allow time for adequate rest and recovery between practices and competition. Keep up conditioning. Keep up ankle and leg flexibility. Improve or keep muscle strength and endurance. Improve cardiovascular fitness. Use proper technique. Use proper equipment (shoes, skates). To help prevent recurrence, taping, protective strapping, or an adhesive bandage may be recommended for several weeks after healing is complete. PROGNOSIS  Recovery may take weeks to several months to heal. Longer recovery is expected if symptoms have been prolonged. Recovery is usually quicker if the inflammation is due to a direct blow as compared with overuse or sudden strain. RELATED COMPLICATIONS  Healing time will be prolonged if the condition is not correctly treated. The injury must be given plenty of time to heal. Symptoms can reoccur if activity is resumed too soon. Untreated, tendinitis may increase the risk of tendon rupture requiring additional time for recovery and possibly surgery. TREATMENT  The first treatment consists of rest anti-inflammatory medication, and ice to relieve the pain. Stretching and strengthening exercises after resolution of pain will likely help reduce the risk of recurrence. Referral to a physical therapist or athletic trainer for further evaluation and treatment may be helpful. A walking boot or cast may be recommended to rest the Achilles tendon. This can help break the cycle of inflammation and microtrauma. Arch supports (orthotics) may be prescribed or recommended by your caregiver as an adjunct to therapy and rest. Surgery to remove the inflamed tendon lining or degenerated tendon tissue is rarely  necessary and has shown less than predictable results. MEDICATION  Nonsteroidal anti-inflammatory medications, such as aspirin and ibuprofen, may be used for pain and inflammation relief.  Do not take within 7 days before surgery. Take these as directed by your caregiver. Contact your caregiver immediately if any bleeding, stomach upset, or signs of allergic reaction occur. Other minor pain relievers, such as acetaminophen, may also be used. Pain relievers may be prescribed as necessary by your caregiver. Do not take prescription pain medication for longer than 4 to 7 days. Use only as directed and only as much as you need. Cortisone injections are rarely indicated. Cortisone injections may weaken tendons and predispose to rupture. It is better to give the condition more time to heal than to use them. HEAT AND COLD Cold is used to relieve pain and reduce inflammation for acute and chronic Achilles tendinitis. Cold should be applied for 10 to 15 minutes every 2 to 3 hours for inflammation and pain and immediately after any activity that aggravates your symptoms. Use ice packs or an ice massage. Heat may be used before performing stretching and strengthening activities prescribed by your caregiver. Use a heat pack or a warm soak. SEEK MEDICAL CARE IF: Symptoms get worse or do not improve in 2 weeks despite treatment. New, unexplained symptoms develop. Drugs used in treatment may produce side effects.  EXERCISES:  RANGE OF MOTION (ROM) AND STRETCHING EXERCISES - Achilles Tendinitis  These exercises may help you when beginning to rehabilitate your injury. Your symptoms may resolve with or without further involvement from your physician, physical therapist or athletic trainer. While completing these exercises, remember:  Restoring tissue flexibility helps normal motion to return to the joints. This allows healthier, less painful movement and activity. An effective stretch should be held for at least 30 seconds. A stretch should never be painful. You should only feel a gentle lengthening or release in the stretched tissue.  STRETCH  Gastroc, Standing  Place hands on wall. Extend right /  left leg, keeping the front knee somewhat bent. Slightly point your toes inward on your back foot. Keeping your right / left heel on the floor and your knee straight, shift your weight toward the wall, not allowing your back to arch. You should feel a gentle stretch in the right / left calf. Hold this position for 10 seconds. Repeat 3 times. Complete this stretch 2 times per day.  STRETCH  Soleus, Standing  Place hands on wall. Extend right / left leg, keeping the other knee somewhat bent. Slightly point your toes inward on your back foot. Keep your right / left heel on the floor, bend your back knee, and slightly shift your weight over the back leg so that you feel a gentle stretch deep in your back calf. Hold this position for 10 seconds. Repeat 3 times. Complete this stretch 2 times per day.  STRETCH  Gastrocsoleus, Standing  Note: This exercise can place a lot of stress on your foot and ankle. Please complete this exercise only if specifically instructed by your caregiver.  Place the ball of your right / left foot on a step, keeping your other foot firmly on the same step. Hold on to the wall or a rail for balance. Slowly lift your other foot, allowing your body weight to press your heel down over the edge of the step. You should feel a stretch in your right / left calf. Hold this position for 10 seconds. Repeat this exercise with a slight  bend in your knee. Repeat 3 times. Complete this stretch 2 times per day.   STRENGTHENING EXERCISES - Achilles Tendinitis These exercises may help you when beginning to rehabilitate your injury. They may resolve your symptoms with or without further involvement from your physician, physical therapist or athletic trainer. While completing these exercises, remember:  Muscles can gain both the endurance and the strength needed for everyday activities through controlled exercises. Complete these exercises as instructed by your physician, physical  therapist or athletic trainer. Progress the resistance and repetitions only as guided. You may experience muscle soreness or fatigue, but the pain or discomfort you are trying to eliminate should never worsen during these exercises. If this pain does worsen, stop and make certain you are following the directions exactly. If the pain is still present after adjustments, discontinue the exercise until you can discuss the trouble with your clinician.  STRENGTH - Plantar-flexors  Sit with your right / left leg extended. Holding onto both ends of a rubber exercise band/tubing, loop it around the ball of your foot. Keep a slight tension in the band. Slowly push your toes away from you, pointing them downward. Hold this position for 10 seconds. Return slowly, controlling the tension in the band/tubing. Repeat 3 times. Complete this exercise 2 times per day.   STRENGTH - Plantar-flexors  Stand with your feet shoulder width apart. Steady yourself with a wall or table using as little support as needed. Keeping your weight evenly spread over the width of your feet, rise up on your toes.* Hold this position for 10 seconds. Repeat 3 times. Complete this exercise 2 times per day.  *If this is too easy, shift your weight toward your right / left leg until you feel challenged. Ultimately, you may be asked to do this exercise with your right / left foot only.  STRENGTH  Plantar-flexors, Eccentric  Note: This exercise can place a lot of stress on your foot and ankle. Please complete this exercise only if specifically instructed by your caregiver.  Place the balls of your feet on a step. With your hands, use only enough support from a wall or rail to keep your balance. Keep your knees straight and rise up on your toes. Slowly shift your weight entirely to your right / left toes and pick up your opposite foot. Gently and with controlled movement, lower your weight through your right / left foot so that your heel  drops below the level of the step. You will feel a slight stretch in the back of your calf at the end position. Use the healthy leg to help rise up onto the balls of both feet, then lower weight only on the right / left leg again. Build up to 15 repetitions. Then progress to 3 consecutive sets of 15 repetitions.* After completing the above exercise, complete the same exercise with a slight knee bend (about 30 degrees). Again, build up to 15 repetitions. Then progress to 3 consecutive sets of 15 repetitions.* Perform this exercise 2 times per day.  *When you easily complete 3 sets of 15, your physician, physical therapist or athletic trainer may advise you to add resistance by wearing a backpack filled with additional weight.  STRENGTH - Plantar Flexors, Seated  Sit on a chair that allows your feet to rest flat on the ground. If necessary, sit at the edge of the chair. Keeping your toes firmly on the ground, lift your right / left heel as far as you can without increasing  any discomfort in your ankle. Repeat 3 times. Complete this exercise 2 times a day.

## 2023-06-11 LAB — WOUND CULTURE
MICRO NUMBER:: 15872070
SPECIMEN QUALITY:: ADEQUATE

## 2023-07-13 ENCOUNTER — Encounter: Payer: Self-pay | Admitting: Internal Medicine

## 2023-07-30 ENCOUNTER — Ambulatory Visit (AMBULATORY_SURGERY_CENTER): Payer: BC Managed Care – PPO

## 2023-07-30 VITALS — Ht 68.75 in | Wt 315.0 lb

## 2023-07-30 DIAGNOSIS — Z1211 Encounter for screening for malignant neoplasm of colon: Secondary | ICD-10-CM

## 2023-07-30 MED ORDER — SUFLAVE 178.7 G PO SOLR: 1.0000 | ORAL | 0 refills | Status: DC

## 2023-07-30 NOTE — Progress Notes (Signed)

## 2023-08-13 ENCOUNTER — Other Ambulatory Visit: Payer: BC Managed Care – PPO

## 2023-08-13 DIAGNOSIS — E7800 Pure hypercholesterolemia, unspecified: Secondary | ICD-10-CM

## 2023-08-13 DIAGNOSIS — Z0000 Encounter for general adult medical examination without abnormal findings: Secondary | ICD-10-CM | POA: Diagnosis not present

## 2023-08-13 DIAGNOSIS — Z6841 Body Mass Index (BMI) 40.0 and over, adult: Secondary | ICD-10-CM

## 2023-08-13 DIAGNOSIS — Z789 Other specified health status: Secondary | ICD-10-CM

## 2023-08-13 DIAGNOSIS — Z125 Encounter for screening for malignant neoplasm of prostate: Secondary | ICD-10-CM

## 2023-08-14 ENCOUNTER — Encounter: Payer: Self-pay | Admitting: Internal Medicine

## 2023-08-14 ENCOUNTER — Ambulatory Visit: Payer: BC Managed Care – PPO | Admitting: Internal Medicine

## 2023-08-14 VITALS — BP 110/80 | HR 80 | Ht 68.75 in | Wt 310.0 lb

## 2023-08-14 DIAGNOSIS — E7800 Pure hypercholesterolemia, unspecified: Secondary | ICD-10-CM

## 2023-08-14 DIAGNOSIS — J309 Allergic rhinitis, unspecified: Secondary | ICD-10-CM | POA: Diagnosis not present

## 2023-08-14 DIAGNOSIS — Z6841 Body Mass Index (BMI) 40.0 and over, adult: Secondary | ICD-10-CM

## 2023-08-14 DIAGNOSIS — Z789 Other specified health status: Secondary | ICD-10-CM

## 2023-08-14 DIAGNOSIS — Z0000 Encounter for general adult medical examination without abnormal findings: Secondary | ICD-10-CM | POA: Diagnosis not present

## 2023-08-14 LAB — CBC WITH DIFFERENTIAL/PLATELET
Absolute Lymphocytes: 1218 {cells}/uL (ref 850–3900)
Absolute Monocytes: 151 {cells}/uL — ABNORMAL LOW (ref 200–950)
Basophils Absolute: 21 {cells}/uL (ref 0–200)
Basophils Relative: 0.5 %
Eosinophils Absolute: 92 {cells}/uL (ref 15–500)
Eosinophils Relative: 2.2 %
HCT: 47.8 % (ref 38.5–50.0)
Hemoglobin: 16.3 g/dL (ref 13.2–17.1)
MCH: 31.8 pg (ref 27.0–33.0)
MCHC: 34.1 g/dL (ref 32.0–36.0)
MCV: 93.4 fL (ref 80.0–100.0)
MPV: 10.9 fL (ref 7.5–12.5)
Monocytes Relative: 3.6 %
Neutro Abs: 2717 {cells}/uL (ref 1500–7800)
Neutrophils Relative %: 64.7 %
Platelets: 166 Thousand/uL (ref 140–400)
RBC: 5.12 Million/uL (ref 4.20–5.80)
RDW: 12.6 % (ref 11.0–15.0)
Total Lymphocyte: 29 %
WBC: 4.2 Thousand/uL (ref 3.8–10.8)

## 2023-08-14 LAB — COMPLETE METABOLIC PANEL WITHOUT GFR
AG Ratio: 2.4 (calc) (ref 1.0–2.5)
ALT: 36 U/L (ref 9–46)
AST: 29 U/L (ref 10–35)
Albumin: 4.8 g/dL (ref 3.6–5.1)
Alkaline phosphatase (APISO): 76 U/L (ref 35–144)
BUN: 14 mg/dL (ref 7–25)
CO2: 26 mmol/L (ref 20–32)
Calcium: 9.4 mg/dL (ref 8.6–10.3)
Chloride: 104 mmol/L (ref 98–110)
Creat: 0.77 mg/dL (ref 0.70–1.30)
Globulin: 2 g/dL (ref 1.9–3.7)
Glucose, Bld: 93 mg/dL (ref 65–99)
Potassium: 4.3 mmol/L (ref 3.5–5.3)
Sodium: 141 mmol/L (ref 135–146)
Total Bilirubin: 0.9 mg/dL (ref 0.2–1.2)
Total Protein: 6.8 g/dL (ref 6.1–8.1)
eGFR: 108 mL/min/1.73m2 (ref >=60)

## 2023-08-14 LAB — LIPID PANEL
Cholesterol: 171 mg/dL (ref <200)
HDL: 49 mg/dL (ref >=40)
LDL Cholesterol (Calc): 105 mg/dL — ABNORMAL HIGH
Non-HDL Cholesterol (Calc): 122 mg/dL (ref <130)
Total CHOL/HDL Ratio: 3.5 (calc) (ref <5.0)
Triglycerides: 84 mg/dL (ref <150)

## 2023-08-14 LAB — PSA: PSA: 0.22 ng/mL (ref <=4.00)

## 2023-08-14 NOTE — Progress Notes (Shared)
 Annual Wellness Visit   Patient Care Team: Margaree Mackintosh, MD as PCP - General (Internal Medicine)  Visit Date: 08/14/23   Chief Complaint  Patient presents with   Annual Exam   Subjective:  Patient: Jorge Hughes, Male DOB: 18-Nov-1970, 53 y.o. MRN: [id number] Jorge Hughes is a 53 y.o. Male who presents today for his Annual Wellness Visit. Patient has history of hyperlipidemia, allergy.  Reports that he has lost some weight since last annual visit, was 323 08/10/2022 and is now 310 - 13 pound.    History of Hyperlipidemia treated with Zetia 10 mg daily. 08/13/2023 Lipid Panel,compared to  08/08/2022: LDL 105, slightly elevated from 100; otherwise normal. Is statin intolerant. Started Zocor in 2010 with CHOL 250 & LDL 162. In 08/2010, he stopped Zocor because of memory issues.    History of Allergies treated with Claritin 10 mg daily.   Labs 08/13/2023 CBC, compared to 08/08/2022: Absolute Monocytes 151, elevated from 139 but still low; otherwise normal.  CMP: WNL   Reports his 1st Colonoscopy is scheduled for 08/2023.   PSA 0.22 08/13/2023    Vaccine Counseling: Due for Flu, Covid-19, and Shingles 1/2; UTD on Tdap. Past Medical History:  Diagnosis Date   Allergy    Hyperlipidemia   Medical/Surgical History Narrative:  2022 - Mild case of COVID-19 when he was visiting family in [place].  2018 - Left Lower Lobe Pneumonia in June.  2009 - Strep Throat  Other - Allergic to Poison Ivy. Despite Obesity he has not shown any signs of Impaired Glucose Tolerance. No known drug allergies. Family History  Problem Relation Age of Onset   Cancer Mother 25       breast   Gallstones Father    Hyperlipidemia Father    Ovarian cysts Sister    Colon polyps Maternal Aunt    Cancer Maternal Grandfather    Diabetes Paternal Grandfather    Rectal cancer Neg Hx    Stomach cancer Neg Hx    Esophageal cancer Neg Hx    Colon cancer Neg Hx   Family History Narrative: Grandfather with hx  of CABG and Impaired Glucose Tolerance. Mother with hx of Breast Cancer. Father has hx of Kidney Stones and Impaired Glucose Tolerance.   1 brother and 1 sister.  Social History   Social History Narrative   Married - lives w/ wife and 3 children (2 daughters, 1 son). Occasionally smokes cigars. Has a masters degree in Management consultant from [employer]. 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 Social alcohol consumption.     Review of Systems  Constitutional:  Negative for chills, fever, malaise/fatigue and weight loss.  HENT:  Negative for hearing loss, sinus pain and sore throat.   Respiratory:  Negative for cough, hemoptysis and shortness of breath.   Cardiovascular:  Negative for chest pain, palpitations, leg swelling and PND.  Gastrointestinal:  Negative for abdominal pain, constipation, diarrhea, heartburn, nausea and vomiting.  Genitourinary:  Negative for dysuria, frequency and urgency.  Musculoskeletal:  Negative for back pain, myalgias and neck pain.  Skin:  Negative for itching and rash.  Neurological:  Negative for dizziness, tingling, seizures and headaches.  Endo/Heme/Allergies:  Negative for polydipsia.  Psychiatric/Behavioral:  Negative for depression. The patient is not nervous/anxious.     Objective:  Vitals: BP 110/80   Pulse 80   Ht 5' 8.75" (1.746 m)   Wt (!) 310 lb (140.6 kg)   SpO2 94%  BMI 46.11 kg/m  Physical Exam Vitals and nursing note reviewed.  Constitutional:      General: He is awake. He is not in acute distress.    Appearance: Normal appearance. He is not ill-appearing or toxic-appearing.  HENT:     Head: Normocephalic and atraumatic.     Right Ear: Tympanic membrane, ear canal and external ear normal.     Left Ear: Tympanic membrane, ear canal and external ear normal.     Mouth/Throat:     Pharynx: Oropharynx is clear.  Eyes:     Extraocular Movements: Extraocular movements intact.     Pupils: Pupils  are equal, round, and reactive to light.  Neck:     Thyroid: No thyroid mass, thyromegaly or thyroid tenderness.     Vascular: No carotid bruit.  Cardiovascular:     Rate and Rhythm: Normal rate and regular rhythm. No extrasystoles are present.    Pulses:          Dorsalis pedis pulses are 1+ on the right side and 1+ on the left side.     Heart sounds: Normal heart sounds. No murmur heard.    No friction rub. No gallop.  Pulmonary:     Effort: Pulmonary effort is normal.     Breath sounds: Normal breath sounds. No decreased breath sounds, wheezing, rhonchi or rales.  Chest:     Chest wall: No mass.  Abdominal:     Palpations: Abdomen is soft. There is no hepatomegaly, splenomegaly or mass.     Tenderness: There is no abdominal tenderness.     Hernia: No hernia is present.  Genitourinary:    Prostate: Normal.  Musculoskeletal:     Cervical back: Normal range of motion.     Right lower leg: No edema.     Left lower leg: No edema.  Lymphadenopathy:     Cervical: No cervical adenopathy.     Upper Body:     Right upper body: No supraclavicular adenopathy.     Left upper body: No supraclavicular adenopathy.  Skin:    General: Skin is warm and dry.  Neurological:     General: No focal deficit present.     Mental Status: He is alert and oriented to person, place, and time. Mental status is at baseline.     Cranial Nerves: Cranial nerves 2-12 are intact.     Sensory: Sensation is intact.     Motor: Motor function is intact.     Coordination: Coordination is intact.     Gait: Gait is intact.     Deep Tendon Reflexes: Reflexes are normal and symmetric.  Psychiatric:        Attention and Perception: Attention normal.        Mood and Affect: Mood normal.        Speech: Speech normal.        Behavior: Behavior normal. Behavior is cooperative.        Thought Content: Thought content normal.        Cognition and Memory: Cognition and memory normal.        Judgment: Judgment normal.    Most Recent Fall Risk Assessment:    08/10/2022    3:19 PM  Fall Risk   Falls in the past year? 1  Number falls in past yr: 0  Injury with Fall? 1  Risk for fall due to : Other (Comment)  Follow up Falls prevention discussed   Most Recent Depression Screenings:    08/14/2023  2:59 PM 08/10/2022    3:19 PM  PHQ 2/9 Scores  PHQ - 2 Score 0 0   Results:  Studies Obtained And Personally Reviewed By Me: Labs:     Component Value Date/Time   NA 141 08/13/2023 0951   K 4.3 08/13/2023 0951   CL 104 08/13/2023 0951   CO2 26 08/13/2023 0951   GLUCOSE 93 08/13/2023 0951   BUN 14 08/13/2023 0951   CREATININE 0.77 08/13/2023 0951   CALCIUM 9.4 08/13/2023 0951   PROT 6.8 08/13/2023 0951   ALBUMIN 4.4 02/05/2017 1214   AST 29 08/13/2023 0951   ALT 36 08/13/2023 0951   ALKPHOS 71 02/05/2017 1214   BILITOT 0.9 08/13/2023 0951   GFRNONAA 97 02/13/2020 1152   GFRAA 113 02/13/2020 1152    Lab Results  Component Value Date   WBC 4.2 08/13/2023   HGB 16.3 08/13/2023   HCT 47.8 08/13/2023   MCV 93.4 08/13/2023   PLT 166 08/13/2023   Lab Results  Component Value Date   CHOL 171 08/13/2023   HDL 49 08/13/2023   LDLCALC 105 (H) 08/13/2023   TRIG 84 08/13/2023   CHOLHDL 3.5 08/13/2023   Lab Results  Component Value Date   HGBA1C 5.4 08/14/2019     Lab Results  Component Value Date   PSA 0.22 08/13/2023   PSA 0.22 08/08/2022   PSA 0.17 03/11/2021     Assessment & Plan:  Other Labs Reviewed today: CBC, compared to 08/08/2022: Absolute Monocytes 151, elevated from 139 but still low; otherwise normal.  CMP: WNL  Hyperlipidemia treated with Zetia 10 mg daily. 08/13/2023 Lipid Panel,compared to  08/08/2022: LDL 105, slightly elevated from 100; otherwise normal. Is statin intolerant. Started Zocor in 2010 with CHOL 250 & LDL 162. In 08/2010, he stopped Zocor because of memory issues.    Allergies treated with Claritin 10 mg daily.    Colonoscopy scheduled for 08/2023.   PSA  0.22 08/13/2023   Vaccine Counseling: Due for Flu, Covid-19, and Shingles 1/2; UTD on Tdap.   Annual wellness visit done today including the all of the following: Reviewed patient's Family Medical History Reviewed and updated list of patient's medical providers Assessment of cognitive impairment was done Assessed patient's functional ability Established a written schedule for health screening services Health Risk Assessent Completed and Reviewed  Discussed health benefits of physical activity, and encouraged him to engage in regular exercise appropriate for his age and condition.    I,Emily Lagle,acting as a Neurosurgeon for Margaree Mackintosh, MD.,have documented all relevant documentation on the behalf of Margaree Mackintosh, MD,as directed by  Margaree Mackintosh, MD while in the presence of Margaree Mackintosh, MD.   ***

## 2023-08-17 NOTE — Patient Instructions (Addendum)
 It was a pleasure to see you today. Please continue current meds and follow up in 6 months. Vaccines recommended. Consider coronary calcium scoring. Has upcoming colonoscopy.

## 2023-08-22 ENCOUNTER — Encounter: Payer: Self-pay | Admitting: Emergency Medicine

## 2023-08-22 ENCOUNTER — Encounter: Payer: Self-pay | Admitting: Internal Medicine

## 2023-08-27 ENCOUNTER — Ambulatory Visit: Payer: Self-pay | Admitting: Internal Medicine

## 2023-08-27 ENCOUNTER — Encounter: Payer: Self-pay | Admitting: Internal Medicine

## 2023-08-27 VITALS — BP 135/70 | HR 74 | Temp 98.4°F | Resp 13 | Ht 68.75 in | Wt 315.0 lb

## 2023-08-27 DIAGNOSIS — D128 Benign neoplasm of rectum: Secondary | ICD-10-CM

## 2023-08-27 DIAGNOSIS — D127 Benign neoplasm of rectosigmoid junction: Secondary | ICD-10-CM

## 2023-08-27 DIAGNOSIS — K635 Polyp of colon: Secondary | ICD-10-CM

## 2023-08-27 DIAGNOSIS — K5730 Diverticulosis of large intestine without perforation or abscess without bleeding: Secondary | ICD-10-CM

## 2023-08-27 DIAGNOSIS — Z1211 Encounter for screening for malignant neoplasm of colon: Secondary | ICD-10-CM | POA: Diagnosis not present

## 2023-08-27 DIAGNOSIS — K648 Other hemorrhoids: Secondary | ICD-10-CM | POA: Diagnosis not present

## 2023-08-27 MED ORDER — SODIUM CHLORIDE 0.9 % IV SOLN: 500.0000 mL | Freq: Once | INTRAVENOUS | Status: DC

## 2023-08-27 NOTE — Progress Notes (Signed)
 Called to room to assist during endoscopic procedure.  Patient ID and intended procedure confirmed with present staff. Received instructions for my participation in the procedure from the performing physician.

## 2023-08-27 NOTE — Progress Notes (Signed)
 GASTROENTEROLOGY PROCEDURE H&P NOTE   Primary Care Physician: Margaree Mackintosh, MD    Reason for Procedure:  Colon cancer screening  Plan:    Colonoscopy  Patient is appropriate for endoscopic procedure(s) in the ambulatory ([facility]) setting.  The nature of the procedure, as well as the risks, benefits, and alternatives were carefully and thoroughly reviewed with the patient. Ample time for discussion and questions allowed. The patient understood, was satisfied, and agreed to proceed.     HPI: Jorge Hughes is a 53 y.o. male who presents for colonoscopy.  Medical history as below.  Tolerated the prep.  No recent chest pain or shortness of breath.  No abdominal pain today.  Past Medical History:  Diagnosis Date   Allergy    Hyperlipidemia     Past Surgical History:  Procedure Laterality Date   WISDOM TOOTH EXTRACTION      Prior to Admission medications   Medication Sig Start Date End Date Taking? Authorizing Provider  B Complex-C (SUPER B COMPLEX PO) Take 1 tablet by mouth daily.    [provider]  Berberine Chloride 500 MG CAPS Take 1 tablet by mouth daily.    [provider]  ezetimibe (ZETIA) 10 MG tablet TAKE 1 TABLET BY MOUTH DAILY 04/01/23   Margaree Mackintosh, MD  ibuprofen (ADVIL) 800 MG tablet Take 800 mg by mouth daily.    [provider]  loratadine (CLARITIN) 10 MG tablet Take 10 mg by mouth as needed.    [provider]    Current Outpatient Medications  Medication Sig Dispense Refill   B Complex-C (SUPER B COMPLEX PO) Take 1 tablet by mouth daily.     Berberine Chloride 500 MG CAPS Take 1 tablet by mouth daily.     ezetimibe (ZETIA) 10 MG tablet TAKE 1 TABLET BY MOUTH DAILY 90 tablet 1   ibuprofen (ADVIL) 800 MG tablet Take 800 mg by mouth daily.     loratadine (CLARITIN) 10 MG tablet Take 10 mg by mouth as needed.     Current Facility-Administered Medications  Medication Dose Route Frequency Provider Last Rate Last Admin    0.9 %  sodium chloride infusion  500 mL Intravenous Once Jestine Bicknell, Carie Caddy, MD        Allergies as of 08/27/2023   (No Known Allergies)    Family History  Problem Relation Age of Onset   Cancer Mother 39       breast   Gallstones Father    Hyperlipidemia Father    Ovarian cysts Sister    Colon polyps Maternal Aunt    Cancer Maternal Grandfather    Diabetes Paternal Grandfather    Rectal cancer Neg Hx    Stomach cancer Neg Hx    Esophageal cancer Neg Hx    Colon cancer Neg Hx     Social History   Socioeconomic History   Marital status: Married    Spouse name: Alondra Sahni   Number of children: 3   Years of education: 20   Highest education level: Not on file  Occupational History   Occupation: Product/process development scientist  Tobacco Use   Smoking status: Former    Types: Cigars   Smokeless tobacco: Never  Vaping Use   Vaping status: Never Used  Substance and Sexual Activity   Alcohol use: Yes    Alcohol/week: 2.0 standard drinks of alcohol    Types: 2 Cans of beer per week    Comment: socially  Drug use: No   Sexual activity: Yes    Partners: Female    Birth control/protection: None  Other Topics Concern   Not on file  Social History Narrative   Married - lives w/ wife and 3 children (2 daughters, 1 son). Occasionally smokes cigars. Has a masters degree in Management consultant from [employer]. 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 Social alcohol consumption.     Social Drivers of Corporate investment banker Strain: Not on file  Food Insecurity: No Food Insecurity (06/01/2023)   Hunger Vital Sign    Worried About Running Out of Food in the Last Year: Never true    Ran Out of Food in the Last Year: Never true  Transportation Needs: No Transportation Needs (06/01/2023)   PRAPARE - Administrator, Civil Service (Medical): No    Lack of Transportation (Non-Medical): No  Physical Activity: Not on file  Stress:  Not on file  Social Connections: Not on file  Intimate Partner Violence: Not At Risk (06/01/2023)   Humiliation, Afraid, Rape, and Kick questionnaire    Fear of Current or Ex-Partner: No    Emotionally Abused: No    Physically Abused: No    Sexually Abused: No    Physical Exam: Vital signs in last 24 hours: @BP  (!) 148/75   Pulse 70   Temp 98.4 F (36.9 C) (Skin)   Ht 5' 8.75" (1.746 m)   Wt (!) 315 lb (142.9 kg)   SpO2 96%   BMI 46.86 kg/m  GEN: NAD EYE: Sclerae anicteric ENT: MMM CV: Non-tachycardic Pulm: CTA b/l GI: Soft, NT/ND NEURO:  Alert & Oriented x 3   Erick Blinks, MD [facility]  08/27/2023 2:34 PM

## 2023-08-27 NOTE — Progress Notes (Signed)
 Pt's states no medical or surgical changes since previsit or office visit.

## 2023-08-27 NOTE — Op Note (Signed)
 [facility] Patient Name: Jorge Hughes Procedure Date: 08/27/2023 2:40 PM MRN: [id number] Endoscopist: Beverley Fiedler , MD, [id number] Age: 53 Referring MD:  Date of Birth: Mar 24, 1971 Gender: Male Account #: [id number] Procedure:                Colonoscopy Indications:              Screening for colorectal malignant neoplasm, This                            is the patient's first colonoscopy Medicines:                Monitored Anesthesia Care Procedure:                Pre-Anesthesia Assessment:                           - Prior to the procedure, a History and Physical                            was performed, and patient medications and                            allergies were reviewed. The patient's tolerance of                            previous anesthesia was also reviewed. The risks                            and benefits of the procedure and the sedation                            options and risks were discussed with the patient.                            All questions were answered, and informed consent                            was obtained. Prior Anticoagulants: The patient has                            taken no anticoagulant or antiplatelet agents. ASA                            Grade Assessment: III - A patient with severe                            systemic disease. After reviewing the risks and                            benefits, the patient was deemed in satisfactory                            condition to undergo the procedure.  After obtaining informed consent, the colonoscope                            was passed under direct vision. Throughout the                            procedure, the patient's blood pressure, pulse, and                            oxygen saturations were monitored continuously. The                            Olympus CF-HQ190L ([id number]) Colonoscope was                            introduced through the anus  and advanced to the                            cecum, identified by appendiceal orifice and                            ileocecal valve. The colonoscopy was performed                            without difficulty. The patient tolerated the                            procedure well. The quality of the bowel                            preparation was good. The ileocecal valve,                            appendiceal orifice, and rectum were photographed. Scope In: 2:48:27 PM Scope Out: 3:03:46 PM Scope Withdrawal Time: 0 hours 10 minutes 44 seconds  Total Procedure Duration: 0 hours 15 minutes 19 seconds  Findings:                 The digital rectal exam was normal.                           A 3 mm polyp was found in the recto-sigmoid colon.                            The polyp was sessile. The polyp was removed with a                            cold snare. Resection and retrieval were complete.                           A 6 mm polyp was found in the proximal rectum. The                            polyp was sessile. The  polyp was removed with a                            cold snare. Resection and retrieval were complete.                           Multiple medium-mouthed and small-mouthed                            diverticula were found in the sigmoid colon,                            descending colon and hepatic flexure.                           Internal hemorrhoids were found during                            retroflexion. The hemorrhoids were small. Complications:            No immediate complications. Estimated Blood Loss:     Estimated blood loss was minimal. Impression:               - One 3 mm polyp at the recto-sigmoid colon,                            removed with a cold snare. Resected and retrieved.                           - One 6 mm polyp in the proximal rectum, removed                            with a cold snare. Resected and retrieved.                           - Moderate  diverticulosis in the sigmoid colon, in                            the descending colon and at the hepatic flexure.                           - Internal hemorrhoids. Recommendation:           - Patient has a contact number available for                            emergencies. The signs and symptoms of potential                            delayed complications were discussed with the                            patient. Return to normal activities tomorrow.                            Written  discharge instructions were provided to the                            patient.                           - Resume previous diet.                           - Continue present medications.                           - Await pathology results.                           - Repeat colonoscopy is recommended. The                            colonoscopy date will be determined after pathology                            results from today's exam become available for                            review. Beverley Fiedler, MD 08/27/2023 3:06:42 PM This report has been signed electronically.

## 2023-08-27 NOTE — Patient Instructions (Signed)

## 2023-08-27 NOTE — Progress Notes (Signed)
 To pacu, VSS. Report to RN.tb

## 2023-08-28 ENCOUNTER — Telehealth: Payer: Self-pay

## 2023-08-28 NOTE — Telephone Encounter (Signed)
 No answer after follow up call. Voice message left.

## 2023-08-31 LAB — SURGICAL PATHOLOGY

## 2023-09-03 ENCOUNTER — Encounter: Payer: Self-pay | Admitting: Internal Medicine

## 2023-10-29 ENCOUNTER — Other Ambulatory Visit: Payer: Self-pay | Admitting: Internal Medicine

## 2024-02-01 ENCOUNTER — Telehealth: Payer: Self-pay | Admitting: Internal Medicine

## 2024-02-04 ENCOUNTER — Other Ambulatory Visit: Payer: BC Managed Care – PPO

## 2024-02-04 DIAGNOSIS — Z789 Other specified health status: Secondary | ICD-10-CM

## 2024-02-04 DIAGNOSIS — E7800 Pure hypercholesterolemia, unspecified: Secondary | ICD-10-CM

## 2024-02-04 DIAGNOSIS — Z0000 Encounter for general adult medical examination without abnormal findings: Secondary | ICD-10-CM

## 2024-02-04 DIAGNOSIS — Z125 Encounter for screening for malignant neoplasm of prostate: Secondary | ICD-10-CM

## 2024-02-05 ENCOUNTER — Ambulatory Visit: Payer: BC Managed Care – PPO | Admitting: Internal Medicine

## 2024-02-05 ENCOUNTER — Encounter: Payer: Self-pay | Admitting: Internal Medicine

## 2024-02-05 ENCOUNTER — Ambulatory Visit: Admitting: Internal Medicine

## 2024-02-05 VITALS — BP 122/80 | Ht 68.75 in | Wt 293.0 lb

## 2024-02-05 DIAGNOSIS — Z6841 Body Mass Index (BMI) 40.0 and over, adult: Secondary | ICD-10-CM

## 2024-02-05 DIAGNOSIS — E7800 Pure hypercholesterolemia, unspecified: Secondary | ICD-10-CM | POA: Diagnosis not present

## 2024-02-05 DIAGNOSIS — Z789 Other specified health status: Secondary | ICD-10-CM | POA: Diagnosis not present

## 2024-02-05 LAB — LIPID PANEL
Cholesterol: 173 mg/dL (ref <200)
HDL: 49 mg/dL (ref >=40)
LDL Cholesterol (Calc): 107 mg/dL — ABNORMAL HIGH
Non-HDL Cholesterol (Calc): 124 mg/dL (ref <130)
Total CHOL/HDL Ratio: 3.5 (calc) (ref <5.0)
Triglycerides: 84 mg/dL (ref <150)

## 2024-02-05 LAB — COMPLETE METABOLIC PANEL WITHOUT GFR
AG Ratio: 2.4 (calc) (ref 1.0–2.5)
ALT: 27 U/L (ref 9–46)
AST: 21 U/L (ref 10–35)
Albumin: 4.6 g/dL (ref 3.6–5.1)
Alkaline phosphatase (APISO): 55 U/L (ref 35–144)
BUN: 21 mg/dL (ref 7–25)
CO2: 25 mmol/L (ref 20–32)
Calcium: 9.1 mg/dL (ref 8.6–10.3)
Chloride: 106 mmol/L (ref 98–110)
Creat: 0.86 mg/dL (ref 0.70–1.30)
Globulin: 1.9 g/dL (ref 1.9–3.7)
Glucose, Bld: 95 mg/dL (ref 65–99)
Potassium: 4.4 mmol/L (ref 3.5–5.3)
Sodium: 141 mmol/L (ref 135–146)
Total Bilirubin: 0.7 mg/dL (ref 0.2–1.2)
Total Protein: 6.5 g/dL (ref 6.1–8.1)

## 2024-02-05 LAB — CBC WITH DIFFERENTIAL/PLATELET
Absolute Lymphocytes: 1285 {cells}/uL (ref 850–3900)
Absolute Monocytes: 180 {cells}/uL — ABNORMAL LOW (ref 200–950)
Basophils Absolute: 22 {cells}/uL (ref 0–200)
Basophils Relative: 0.5 %
Eosinophils Absolute: 101 {cells}/uL (ref 15–500)
Eosinophils Relative: 2.3 %
HCT: 46.5 % (ref 38.5–50.0)
Hemoglobin: 15.5 g/dL (ref 13.2–17.1)
MCH: 31.4 pg (ref 27.0–33.0)
MCHC: 33.3 g/dL (ref 32.0–36.0)
MCV: 94.1 fL (ref 80.0–100.0)
MPV: 10.8 fL (ref 7.5–12.5)
Monocytes Relative: 4.1 %
Neutro Abs: 2812 {cells}/uL (ref 1500–7800)
Neutrophils Relative %: 63.9 %
Platelets: 189 Thousand/uL (ref 140–400)
RBC: 4.94 Million/uL (ref 4.20–5.80)
RDW: 12.6 % (ref 11.0–15.0)
Total Lymphocyte: 29.2 %
WBC: 4.4 Thousand/uL (ref 3.8–10.8)

## 2024-02-05 LAB — PSA: PSA: 0.24 ng/mL (ref <=4.00)

## 2024-02-05 NOTE — Patient Instructions (Addendum)
 Labs are stable. Continue diet and exercise program. Coronary calcium score discussed. He will think about it. He will return in 6 months for CPE. Declined pneumococcal vaccine today. No change in meds.

## 2024-02-05 NOTE — Progress Notes (Signed)
 Patient Care Team: Perri Ronal PARAS, MD as PCP - General (Internal Medicine)  Visit Date: 02/05/24  Subjective:   Chief Complaint  Patient presents with  . Hyperlipidemia   Patient PI:Jorge Hughes,Male DOB:December 23, 1970,53 y.o. FMW:[id number]   53 y.o.Male presents today for 6 months follow-up for Hyperlipidemia. Patient has a past medical history of Statin Intolerance; Obesity; Statin Intolerance. Seen for annual visit on 08/14/2023, in the interim has not been seen anywhere within [facility].  History of Hyperlipidemia treated with Zetia  10 mg daily. 02/04/2024 Lipid Panel: LDL 107, elevated from 105; otherwise WNL.   Other labs reviewed: PSA 0.24; C-Met WNL; Absolute Monocytes 180, elevated from 151 in February.   Vaccine Counseling: Discussed, declines Hepatitis B, Covid-19, and is agreeable to PNA and Shingrix. Past Medical History:  Diagnosis Date  . Allergy   . Hyperlipidemia   No Known Allergies Immunization History  Administered Date(s) Administered  . Influenza,inj,Quad PF,6+ Mos 06/15/2015, 05/17/2016, 02/08/2017, 02/14/2018, 02/17/2019, 03/11/2020, 03/14/2021  . Tdap 07/04/2003, 10/29/2014, 06/01/2023   Past Surgical History:  Procedure Laterality Date  . WISDOM TOOTH EXTRACTION      Family History  Problem Relation Age of Onset  . Cancer Mother 20       breast  . Gallstones Father   . Hyperlipidemia Father   . Ovarian cysts Sister   . Colon polyps Maternal Aunt   . Cancer Maternal Grandfather   . Diabetes Paternal Grandfather   . Rectal cancer Neg Hx   . Stomach cancer Neg Hx   . Esophageal cancer Neg Hx   . Colon cancer Neg Hx    Social History   Social History Narrative   Married - lives w/ wife and 3 children (2 daughters, 1 son). Occasionally smokes cigars. Has a masters degree in Management consultant from [employer]. 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 Social alcohol consumption.     Review of Systems   Constitutional:  Negative for fever and malaise/fatigue.  HENT:  Negative for congestion.   Eyes:  Negative for blurred vision.  Respiratory:  Negative for cough and shortness of breath.   Cardiovascular:  Negative for chest pain, palpitations and leg swelling.  Gastrointestinal:  Negative for vomiting.  Musculoskeletal:  Negative for back pain.  Skin:  Negative for rash.  Neurological:  Negative for loss of consciousness and headaches.     Objective:  Vitals: BP 122/80   Ht 5' 8.75 (1.746 m)   Wt 293 lb (132.9 kg)   BMI 43.58 kg/m   Physical Exam Vitals and nursing note reviewed.  Constitutional:      General: He is not in acute distress.    Appearance: Normal appearance. He is not ill-appearing.  HENT:     Head: Normocephalic and atraumatic.  Cardiovascular:     Rate and Rhythm: Normal rate and regular rhythm.     Pulses: Normal pulses.     Heart sounds: Normal heart sounds. No murmur heard.    No friction rub. No gallop.  Pulmonary:     Effort: Pulmonary effort is normal. No respiratory distress.     Breath sounds: Normal breath sounds. No wheezing or rales.  Musculoskeletal:     Cervical back: Neck supple.  Skin:    General: Skin is warm and dry.  Neurological:     Mental Status: He is alert and oriented to person, place, and time. Mental status is at baseline.  Psychiatric:  Mood and Affect: Mood normal.        Behavior: Behavior normal.        Thought Content: Thought content normal.        Judgment: Judgment normal.     Results:  Studies Obtained And Personally Reviewed By Me: Labs:  CBC w/ Differential Lab Results  Component Value Date   WBC 4.4 02/04/2024   RBC 4.94 02/04/2024   HGB 15.5 02/04/2024   HCT 46.5 02/04/2024   PLT 189 02/04/2024   MCV 94.1 02/04/2024   MCH 31.4 02/04/2024   MCHC 33.3 02/04/2024   RDW 12.6 02/04/2024   MPV 10.8 02/04/2024   LYMPHSABS 1,102 08/08/2022   MONOABS 0.2 05/10/2021   BASOSABS 22 02/04/2024     Comprehensive Metabolic Panel Lab Results  Component Value Date   NA 141 02/04/2024   K 4.4 02/04/2024   CL 106 02/04/2024   CO2 25 02/04/2024   GLUCOSE 95 02/04/2024   BUN 21 02/04/2024   CREATININE 0.86 02/04/2024   CALCIUM 9.1 02/04/2024   PROT 6.5 02/04/2024   ALBUMIN 4.4 02/05/2017   AST 21 02/04/2024   ALT 27 02/04/2024   ALKPHOS 71 02/05/2017   BILITOT 0.7 02/04/2024   EGFR 108 08/13/2023   GFRNONAA 97 02/13/2020   Lipid Panel  Lab Results  Component Value Date   CHOL 173 02/04/2024   HDL 49 02/04/2024   LDLCALC 107 (H) 02/04/2024   TRIG 84 02/04/2024   A1c Lab Results  Component Value Date   HGBA1C 5.4 08/14/2019    Assessment & Plan:   Hyperlipidemia treated with Zetia  10 mg daily. 02/04/2024 Lipid Panel: LDL 107, elevated from 105; otherwise WNL. Discussed CT Coronary Calcium Scoring, and he will contemplate doing this. Labs are stable, continue diet and exercise regimen.   Other labs reviewed: PSA 0.24; C-Met WNL; Absolute Monocytes 180, elevated from 151 in February.   Vaccine Counseling: Discussed, declines Hepatitis B, Covid-19, and is agreeable to PNA and Shingrix.  Return in 6 months (on 08/11/2024) for annual labs, and then on 08/12/2024 for annual visit, or as needed.   I,Emily Lagle,acting as a Neurosurgeon for Ronal JINNY Hailstone, MD.,have documented all relevant documentation on the behalf of Ronal JINNY Hailstone, MD,as directed by  Ronal JINNY Hailstone, MD while in the presence of Ronal JINNY Hailstone, MD.  I, Ronal JINNY Hailstone, MD, have reviewed all documentation for this visit. The documentation on 02/05/2024 for the exam, diagnosis, procedures, and orders are all accurate and complete.

## 2024-05-30 NOTE — Telephone Encounter (Signed)
 done

## 2024-06-11 ENCOUNTER — Other Ambulatory Visit: Payer: Self-pay | Admitting: Internal Medicine

## 2024-07-22 ENCOUNTER — Encounter: Payer: Self-pay | Admitting: Internal Medicine

## 2024-08-11 ENCOUNTER — Other Ambulatory Visit: Payer: Self-pay

## 2024-08-12 ENCOUNTER — Encounter: Payer: Self-pay | Admitting: Internal Medicine
